# Patient Record
Sex: Female | Born: 1935 | Race: White | Hispanic: No | Smoking: Never smoker
Health system: Southern US, Community
[De-identification: ages and names within clinical notes are randomized; demographics above are authoritative.]

## PROBLEM LIST (undated history)

## (undated) DIAGNOSIS — F329 Major depressive disorder, single episode, unspecified: Secondary | ICD-10-CM

## (undated) DIAGNOSIS — F419 Anxiety disorder, unspecified: Secondary | ICD-10-CM

## (undated) DIAGNOSIS — T7840XA Allergy, unspecified, initial encounter: Secondary | ICD-10-CM

## (undated) DIAGNOSIS — I2699 Other pulmonary embolism without acute cor pulmonale: Secondary | ICD-10-CM

## (undated) DIAGNOSIS — Z862 Personal history of diseases of the blood and blood-forming organs and certain disorders involving the immune mechanism: Secondary | ICD-10-CM

## (undated) DIAGNOSIS — K760 Fatty (change of) liver, not elsewhere classified: Secondary | ICD-10-CM

## (undated) DIAGNOSIS — C801 Malignant (primary) neoplasm, unspecified: Secondary | ICD-10-CM

## (undated) DIAGNOSIS — R0789 Other chest pain: Secondary | ICD-10-CM

## (undated) DIAGNOSIS — K5732 Diverticulitis of large intestine without perforation or abscess without bleeding: Secondary | ICD-10-CM

## (undated) DIAGNOSIS — E781 Pure hyperglyceridemia: Secondary | ICD-10-CM

## (undated) DIAGNOSIS — H8110 Benign paroxysmal vertigo, unspecified ear: Secondary | ICD-10-CM

## (undated) DIAGNOSIS — D869 Sarcoidosis, unspecified: Secondary | ICD-10-CM

## (undated) DIAGNOSIS — I447 Left bundle-branch block, unspecified: Secondary | ICD-10-CM

## (undated) DIAGNOSIS — I10 Essential (primary) hypertension: Secondary | ICD-10-CM

## (undated) DIAGNOSIS — F32A Depression, unspecified: Secondary | ICD-10-CM

## (undated) DIAGNOSIS — M48061 Spinal stenosis, lumbar region without neurogenic claudication: Secondary | ICD-10-CM

## (undated) DIAGNOSIS — M25519 Pain in unspecified shoulder: Secondary | ICD-10-CM

## (undated) DIAGNOSIS — K644 Residual hemorrhoidal skin tags: Secondary | ICD-10-CM

## (undated) DIAGNOSIS — Z972 Presence of dental prosthetic device (complete) (partial): Secondary | ICD-10-CM

## (undated) DIAGNOSIS — Z973 Presence of spectacles and contact lenses: Secondary | ICD-10-CM

## (undated) HISTORY — PX: HX SMALL BOWEL RESECTION: SHX150

## (undated) HISTORY — PX: HX GALL BLADDER SURGERY/CHOLE: SHX55

## (undated) HISTORY — PX: HX APPENDECTOMY: SHX54

## (undated) HISTORY — PX: HX HYSTERECTOMY: SHX81

## (undated) HISTORY — PX: HX MASTECTOMY, SIMPLE: SHX3

## (undated) HISTORY — DX: Pain in unspecified shoulder: M25.519

## (undated) HISTORY — DX: Depression, unspecified: F32.A

## (undated) HISTORY — DX: Personal history of diseases of the blood and blood-forming organs and certain disorders involving the immune mechanism: Z86.2

## (undated) HISTORY — DX: Fatty (change of) liver, not elsewhere classified: K76.0

## (undated) HISTORY — DX: Allergy, unspecified, initial encounter: T78.40XA

## (undated) HISTORY — DX: Spinal stenosis, lumbar region without neurogenic claudication: M48.061

## (undated) HISTORY — DX: Diverticulitis of large intestine without perforation or abscess without bleeding: K57.32

## (undated) HISTORY — DX: Sarcoidosis, unspecified: D86.9

## (undated) HISTORY — DX: Other chest pain: R07.89

## (undated) HISTORY — DX: Residual hemorrhoidal skin tags: K64.4

## (undated) HISTORY — DX: Pure hyperglyceridemia: E78.1

## (undated) HISTORY — PX: OTHER SURGICAL HISTORY: SHX169

## (undated) HISTORY — DX: Benign paroxysmal vertigo, unspecified ear: H81.10

## (undated) HISTORY — DX: Anxiety disorder, unspecified: F41.9

## (undated) HISTORY — DX: Other pulmonary embolism without acute cor pulmonale: I26.99

## (undated) HISTORY — DX: Essential (primary) hypertension: I10

## (undated) HISTORY — DX: Left bundle-branch block, unspecified: I44.7

## (undated) HISTORY — DX: Major depressive disorder, single episode, unspecified: F32.9

---

## 1973-05-10 HISTORY — PX: MASTECTOMY: SHX3

## 1973-05-10 HISTORY — PX: BREAST SURGERY: SHX581

## 1978-05-10 HISTORY — PX: ABDOMINAL HYSTERECTOMY: SHX81

## 1980-05-10 HISTORY — PX: BILATERAL OOPHORECTOMY: SHX1221

## 1987-05-11 HISTORY — PX: PARTIAL COLECTOMY: SHX5273

## 1988-05-10 HISTORY — PX: OTHER SURGICAL HISTORY: SHX169

## 1989-05-10 HISTORY — PX: CHOLECYSTECTOMY: SHX55

## 2004-07-08 LAB — HM COLONOSCOPY

## 2004-12-03 ENCOUNTER — Ambulatory Visit: Payer: Self-pay | Admitting: Internal Medicine

## 2004-12-31 ENCOUNTER — Ambulatory Visit: Payer: Self-pay | Admitting: Internal Medicine

## 2005-01-21 ENCOUNTER — Ambulatory Visit: Payer: Self-pay | Admitting: Internal Medicine

## 2005-03-04 ENCOUNTER — Ambulatory Visit: Payer: Self-pay | Admitting: Internal Medicine

## 2005-03-09 ENCOUNTER — Ambulatory Visit: Payer: Self-pay | Admitting: Family Medicine

## 2005-05-07 ENCOUNTER — Ambulatory Visit: Payer: Self-pay | Admitting: Family Medicine

## 2005-06-01 ENCOUNTER — Ambulatory Visit: Payer: Self-pay | Admitting: Internal Medicine

## 2005-06-02 ENCOUNTER — Ambulatory Visit: Payer: Self-pay | Admitting: Family Medicine

## 2005-06-04 ENCOUNTER — Ambulatory Visit: Payer: Self-pay | Admitting: Internal Medicine

## 2005-06-16 ENCOUNTER — Ambulatory Visit: Payer: Self-pay | Admitting: Family Medicine

## 2005-06-18 ENCOUNTER — Ambulatory Visit: Payer: Self-pay | Admitting: Family Medicine

## 2005-06-28 ENCOUNTER — Ambulatory Visit: Payer: Self-pay | Admitting: Family Medicine

## 2005-08-04 ENCOUNTER — Ambulatory Visit: Payer: Self-pay | Admitting: Family Medicine

## 2005-11-08 ENCOUNTER — Ambulatory Visit: Payer: Self-pay | Admitting: Family Medicine

## 2005-11-19 ENCOUNTER — Ambulatory Visit: Payer: Self-pay | Admitting: Family Medicine

## 2006-02-11 ENCOUNTER — Ambulatory Visit: Payer: Self-pay | Admitting: Family Medicine

## 2006-02-15 DIAGNOSIS — K5732 Diverticulitis of large intestine without perforation or abscess without bleeding: Secondary | ICD-10-CM | POA: Insufficient documentation

## 2006-02-15 DIAGNOSIS — D869 Sarcoidosis, unspecified: Secondary | ICD-10-CM

## 2006-02-15 DIAGNOSIS — E781 Pure hyperglyceridemia: Secondary | ICD-10-CM | POA: Insufficient documentation

## 2006-04-06 ENCOUNTER — Ambulatory Visit: Payer: Self-pay | Admitting: Family Medicine

## 2006-04-06 DIAGNOSIS — F330 Major depressive disorder, recurrent, mild: Secondary | ICD-10-CM

## 2006-04-06 DIAGNOSIS — M159 Polyosteoarthritis, unspecified: Secondary | ICD-10-CM | POA: Insufficient documentation

## 2006-04-06 DIAGNOSIS — J309 Allergic rhinitis, unspecified: Secondary | ICD-10-CM | POA: Insufficient documentation

## 2006-04-12 ENCOUNTER — Telehealth: Payer: Self-pay | Admitting: Family Medicine

## 2006-04-19 ENCOUNTER — Ambulatory Visit: Payer: Self-pay | Admitting: Family Medicine

## 2006-04-22 ENCOUNTER — Telehealth: Payer: Self-pay | Admitting: Family Medicine

## 2006-07-21 ENCOUNTER — Encounter: Payer: Self-pay | Admitting: Family Medicine

## 2006-09-06 ENCOUNTER — Ambulatory Visit: Payer: Self-pay | Admitting: Family Medicine

## 2006-10-09 DIAGNOSIS — M48061 Spinal stenosis, lumbar region without neurogenic claudication: Secondary | ICD-10-CM

## 2006-10-09 HISTORY — DX: Spinal stenosis, lumbar region without neurogenic claudication: M48.061

## 2006-10-17 ENCOUNTER — Ambulatory Visit: Payer: Self-pay | Admitting: Family Medicine

## 2006-10-20 ENCOUNTER — Telehealth: Payer: Self-pay | Admitting: Family Medicine

## 2006-10-21 ENCOUNTER — Telehealth (INDEPENDENT_AMBULATORY_CARE_PROVIDER_SITE_OTHER): Payer: Self-pay | Admitting: *Deleted

## 2006-10-24 ENCOUNTER — Telehealth: Payer: Self-pay | Admitting: Family Medicine

## 2006-10-24 ENCOUNTER — Ambulatory Visit: Payer: Self-pay | Admitting: Family Medicine

## 2006-11-18 ENCOUNTER — Telehealth: Payer: Self-pay | Admitting: Family Medicine

## 2006-12-21 ENCOUNTER — Encounter: Payer: Self-pay | Admitting: Family Medicine

## 2007-02-22 ENCOUNTER — Ambulatory Visit: Payer: Self-pay | Admitting: Family Medicine

## 2007-03-24 ENCOUNTER — Ambulatory Visit: Payer: Self-pay | Admitting: Family Medicine

## 2007-03-24 DIAGNOSIS — M810 Age-related osteoporosis without current pathological fracture: Secondary | ICD-10-CM | POA: Insufficient documentation

## 2007-03-24 DIAGNOSIS — I10 Essential (primary) hypertension: Secondary | ICD-10-CM

## 2007-03-28 ENCOUNTER — Encounter: Admission: RE | Admit: 2007-03-28 | Discharge: 2007-03-28 | Payer: Self-pay | Admitting: Family Medicine

## 2007-03-30 ENCOUNTER — Ambulatory Visit: Payer: Self-pay | Admitting: Family Medicine

## 2007-04-04 ENCOUNTER — Encounter: Payer: Self-pay | Admitting: Family Medicine

## 2007-04-20 ENCOUNTER — Ambulatory Visit: Payer: Self-pay | Admitting: Family Medicine

## 2007-04-21 ENCOUNTER — Telehealth (INDEPENDENT_AMBULATORY_CARE_PROVIDER_SITE_OTHER): Payer: Self-pay | Admitting: *Deleted

## 2007-04-26 ENCOUNTER — Telehealth (INDEPENDENT_AMBULATORY_CARE_PROVIDER_SITE_OTHER): Payer: Self-pay | Admitting: *Deleted

## 2007-12-05 ENCOUNTER — Encounter: Admission: RE | Admit: 2007-12-05 | Discharge: 2007-12-05 | Payer: Self-pay | Admitting: Family Medicine

## 2007-12-05 ENCOUNTER — Ambulatory Visit: Payer: Self-pay | Admitting: Family Medicine

## 2007-12-05 DIAGNOSIS — R0602 Shortness of breath: Secondary | ICD-10-CM | POA: Insufficient documentation

## 2008-02-20 ENCOUNTER — Telehealth: Payer: Self-pay | Admitting: Family Medicine

## 2008-03-01 ENCOUNTER — Ambulatory Visit: Payer: Self-pay | Admitting: Family Medicine

## 2008-03-01 DIAGNOSIS — M549 Dorsalgia, unspecified: Secondary | ICD-10-CM | POA: Insufficient documentation

## 2008-03-04 ENCOUNTER — Telehealth: Payer: Self-pay | Admitting: Family Medicine

## 2008-08-14 ENCOUNTER — Ambulatory Visit: Payer: Self-pay | Admitting: Family Medicine

## 2008-09-23 ENCOUNTER — Ambulatory Visit: Payer: Self-pay | Admitting: Family Medicine

## 2008-09-24 ENCOUNTER — Ambulatory Visit: Payer: Self-pay | Admitting: Family Medicine

## 2008-09-24 ENCOUNTER — Encounter: Admission: RE | Admit: 2008-09-24 | Discharge: 2008-09-24 | Payer: Self-pay | Admitting: Family Medicine

## 2008-10-14 ENCOUNTER — Telehealth: Payer: Self-pay | Admitting: Family Medicine

## 2008-11-05 ENCOUNTER — Ambulatory Visit: Payer: Self-pay | Admitting: Family Medicine

## 2008-11-05 DIAGNOSIS — M48061 Spinal stenosis, lumbar region without neurogenic claudication: Secondary | ICD-10-CM | POA: Insufficient documentation

## 2008-11-07 ENCOUNTER — Telehealth (INDEPENDENT_AMBULATORY_CARE_PROVIDER_SITE_OTHER): Payer: Self-pay | Admitting: *Deleted

## 2008-11-19 ENCOUNTER — Telehealth: Payer: Self-pay | Admitting: Family Medicine

## 2009-02-08 ENCOUNTER — Encounter: Payer: Self-pay | Admitting: Family Medicine

## 2009-03-24 ENCOUNTER — Telehealth: Payer: Self-pay | Admitting: Family Medicine

## 2009-03-24 ENCOUNTER — Ambulatory Visit: Payer: Self-pay | Admitting: Family Medicine

## 2009-03-24 DIAGNOSIS — K644 Residual hemorrhoidal skin tags: Secondary | ICD-10-CM | POA: Insufficient documentation

## 2009-03-24 DIAGNOSIS — H8110 Benign paroxysmal vertigo, unspecified ear: Secondary | ICD-10-CM | POA: Insufficient documentation

## 2009-05-06 ENCOUNTER — Ambulatory Visit: Payer: Self-pay | Admitting: Family Medicine

## 2009-05-06 ENCOUNTER — Encounter: Admission: RE | Admit: 2009-05-06 | Discharge: 2009-05-06 | Payer: Self-pay | Admitting: Family Medicine

## 2009-05-06 DIAGNOSIS — M25519 Pain in unspecified shoulder: Secondary | ICD-10-CM | POA: Insufficient documentation

## 2009-06-13 ENCOUNTER — Ambulatory Visit: Payer: Self-pay | Admitting: Family Medicine

## 2009-06-27 ENCOUNTER — Telehealth: Payer: Self-pay | Admitting: Family Medicine

## 2009-07-01 ENCOUNTER — Ambulatory Visit: Payer: Self-pay | Admitting: Family Medicine

## 2009-07-01 ENCOUNTER — Telehealth: Payer: Self-pay | Admitting: Family Medicine

## 2009-07-01 ENCOUNTER — Encounter: Admission: RE | Admit: 2009-07-01 | Discharge: 2009-07-01 | Payer: Self-pay | Admitting: Family Medicine

## 2009-07-01 LAB — CONVERTED CEMR LAB
Basophils Absolute: 0 10*3/uL (ref 0.0–0.1)
Basophils Relative: 0 % (ref 0–1)
CO2: 30 meq/L (ref 19–32)
Calcium: 9.3 mg/dL (ref 8.4–10.5)
Creatinine, Ser: 1.4 mg/dL — ABNORMAL HIGH (ref 0.40–1.20)
Eosinophils Absolute: 0.1 10*3/uL (ref 0.0–0.7)
Eosinophils Relative: 3 % (ref 0–5)
Hemoglobin: 13.4 g/dL (ref 12.0–15.0)
Lymphs Abs: 1.8 10*3/uL (ref 0.7–4.0)
MCHC: 34.8 g/dL (ref 30.0–36.0)
Monocytes Relative: 2 % — ABNORMAL LOW (ref 3–12)
Potassium: 4.5 meq/L (ref 3.5–5.3)
WBC: 4.9 10*3/uL (ref 4.0–10.5)

## 2009-07-07 ENCOUNTER — Encounter: Payer: Self-pay | Admitting: Family Medicine

## 2009-07-08 ENCOUNTER — Telehealth (INDEPENDENT_AMBULATORY_CARE_PROVIDER_SITE_OTHER): Payer: Self-pay | Admitting: *Deleted

## 2009-07-18 ENCOUNTER — Telehealth: Payer: Self-pay | Admitting: Family Medicine

## 2009-07-25 ENCOUNTER — Telehealth (INDEPENDENT_AMBULATORY_CARE_PROVIDER_SITE_OTHER): Payer: Self-pay | Admitting: *Deleted

## 2009-09-08 ENCOUNTER — Ambulatory Visit: Payer: Self-pay | Admitting: Family Medicine

## 2009-09-08 DIAGNOSIS — F411 Generalized anxiety disorder: Secondary | ICD-10-CM | POA: Insufficient documentation

## 2009-09-09 ENCOUNTER — Telehealth: Payer: Self-pay | Admitting: Family Medicine

## 2009-09-09 ENCOUNTER — Encounter: Payer: Self-pay | Admitting: Family Medicine

## 2009-09-12 ENCOUNTER — Encounter: Payer: Self-pay | Admitting: Family Medicine

## 2009-09-12 LAB — CONVERTED CEMR LAB
Cholesterol: 236 mg/dL — ABNORMAL HIGH (ref 0–200)
HDL: 47 mg/dL (ref >39)
LDL Cholesterol: 154 mg/dL — ABNORMAL HIGH (ref 0–99)
LDL Goal: 130 mg/dL
Total CHOL/HDL Ratio: 5
Triglycerides: 176 mg/dL — ABNORMAL HIGH (ref <150)

## 2009-12-06 ENCOUNTER — Ambulatory Visit: Payer: Self-pay | Admitting: Emergency Medicine

## 2009-12-06 DIAGNOSIS — F329 Major depressive disorder, single episode, unspecified: Secondary | ICD-10-CM

## 2009-12-06 DIAGNOSIS — J209 Acute bronchitis, unspecified: Secondary | ICD-10-CM | POA: Insufficient documentation
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
## 2010-06-09 NOTE — Letter (Signed)
Summary: Depression & Anxiety Questionnaire/[facility]  Depression & Anxiety Questionnaire/[facility]   Imported By: Lanelle Bal 09/15/2009 08:38:39  _____________________________________________________________________  External Attachment:    Type:   Image     Comment:   External Document
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
## 2010-06-09 NOTE — Assessment & Plan Note (Signed)
Summary: Cough-clear, wheezing x today rm 3   Vital Signs:  Patient Profile:   75 Years Old Female CC:      Cold & URI symptoms Height:     61.5 inches Weight:      136 pounds O2 Sat:      98 % O2 treatment:    Room Air Temp:     97.3 degrees F oral Pulse rate:   74 / minute Pulse rhythm:   regular Resp:     16 per minute BP sitting:   139 / 69  (right arm) Cuff size:   regular  Vitals Entered By: Areta Haber CMA (December 06, 2009 9:20 AM)                  Current Allergies: No known allergies History of Present Illness Chief Complaint: Cold & URI symptoms History of Present Illness: URI symptoms x2 weeks.  Went to ER 2 weeks ago, given Zpak.  Felt much better than 2 days later felt worse again.  Coughing, green sputum, fatigue.  She has a history of pulmonary sarcoidosis and has multiple CXR lately and states that she had a CT last week due to a upper lobe nodule that they are following.  No CP, SOB, fever, chills, N/V.  Current Problems: ACUTE BRONCHITIS (ICD-466.0) DEPRESSION (ICD-311) ANXIETY (ICD-300.00) ANXIETY STATE, UNSPECIFIED (ICD-300.00) COUGH (ICD-786.2) SHOULDER PAIN (ICD-719.41) BENIGN POSITIONAL VERTIGO (ICD-386.11) EXTERNAL HEMORRHOIDS (ICD-455.3) SPINAL STENOSIS, LUMBAR (ICD-724.02) BACK PAIN, UPPER (ICD-724.5) SOB (ICD-786.05) OSTEOPOROSIS (ICD-733.00) ESSENTIAL HYPERTENSION, BENIGN (ICD-401.1) SPRAIN/STRAIN, LUMBAR REGION (ICD-847.2) DEPRESSIVE DISORDER, MAJOR, RCR, MILD (ICD-296.31) ALLERGIC RHINITIS (ICD-477.9) SARCOIDOSIS (ICD-135) OSTEOARTHROSIS, GENERALIZED, MULTIPLE SITES (ICD-715.09) HYPERTRIGLYCERIDEMIA (ICD-272.1) DIVERTICULITIS OF COLON, NOS (ICD-562.11)   Current Meds BONIVA 150 MG TABS (IBANDRONATE SODIUM) once by mouth monthly MULTIVITAMINS TABS (MULTIPLE VITAMIN) once by mouth daily FELDENE 10 MG CAPS (PIROXICAM) Take 1 tablet by mouth two times a day LISINOPRIL-HYDROCHLOROTHIAZIDE 20-12.5 MG TABS  (LISINOPRIL-HYDROCHLOROTHIAZIDE) Take 1 tablet by mouth once a day PREVACID 30 MG CPDR (LANSOPRAZOLE) Take 1 tablet by mouth once a day * CALCIUM WITH VITAMIN D Take 1 tablet by mouth two times a day * SUSTENEX Take 1 tablet by mouth once a day LEXAPRO 10 MG TABS (ESCITALOPRAM OXALATE) Take 1 tablet by mouth once a day ALPRAZOLAM 0.5 MG TABS (ALPRAZOLAM) 1/2 to 1 tab by mouth ddaily as needed for anxiety. SIMVASTATIN 20 MG TABS (SIMVASTATIN) Take 1 tablet by mouth once a day at bedtime MUCINEX 600 MG XR12H-TAB (GUAIFENESIN) as directed ALIGN  CAPS (PROBIOTIC PRODUCT) 1 tab by mouth once daily HYDROMET 5-1.5 MG/5ML SYRP (HYDROCODONE-HOMATROPINE) 1 tsp by mouth at bedtime as needed for cough ZITHROMAX 250 MG TABS (AZITHROMYCIN) 2 tabs by mouth on day #1, then 1 tab by mouth on days 2-10  REVIEW OF SYSTEMS Constitutional Symptoms      Denies fever, chills, night sweats, weight loss, weight gain, and fatigue.  Eyes       Denies change in vision, eye pain, eye discharge, glasses, contact lenses, and eye surgery. Ear/Nose/Throat/Mouth       Denies hearing loss/aids, change in hearing, ear pain, ear discharge, dizziness, frequent runny nose, frequent nose bleeds, sinus problems, sore throat, hoarseness, and tooth pain or bleeding.  Respiratory       Complains of productive cough and wheezing.      Denies dry cough, shortness of breath, asthma, bronchitis, and emphysema/COPD.  Cardiovascular       Denies murmurs, chest pain, and tires easily with exhertion.  Gastrointestinal       Denies stomach pain, nausea/vomiting, diarrhea, constipation, blood in bowel movements, and indigestion. Genitourniary       Denies painful urination, kidney stones, and loss of urinary control. Neurological       Denies paralysis, seizures, and fainting/blackouts. Musculoskeletal       Denies muscle pain, joint pain, joint stiffness, decreased range of motion, redness, swelling, muscle weakness, and gout.  Skin        Denies bruising, unusual mles/lumps or sores, and hair/skin or nail changes.  Psych       Denies mood changes, temper/anger issues, anxiety/stress, speech problems, depression, and sleep problems. Other Comments: Pt states she was at [facility] ER on 11/17/09 Dx Bronchitis prescribed  Z-Pak. Pt states she had Hydromet at home. Pt has not followed up with her PCP for this.   Past History:  Past Surgical History: Last updated: 03/01/2008 Bilat mastectomy-prophylactic 1975, Bilat oophorectomy  1982, carpel tunnel  1990, Cholecystectomy  1991, Hysterectomy , fibroids  1980, PFTs-no obstruction, Saline Breast Implants currently Partial colon resectin for diverticulitis  Family History: Last updated: 04/06/2006 BrCa_mother, 4 sisters, 3 neices  Social History: Last updated: 03/01/2008 Retired.  Associates degree.  Widowed.  Never smoked.  No EtOH, no drugs, 2 caffeinated drinks pr day.  No regular exercise. Grandaughter is Research scientist (physical sciences).   Risk Factors: Smoking Status: never (02/21/2006)  Past Medical History: Fatty liver on CT, G6 P3 A3(mis) Hx of sarcoidosis Lumbar stenosis at L4, L MRI 6-08; saw Dr Blanche East Anxiety Depression Physical Exam General appearance: well developed, well nourished, no acute distress Ears: normal, no lesions or deformities Nasal: mucosa pink, nonedematous, no septal deviation, turbinates normal Oral/Pharynx: tongue normal, posterior pharynx without erythema or exudate Chest/Lungs: no rales, wheezes, or rhonchi bilateral, breath sounds equal without effort Heart: regular rate and  rhythm, no murmur Skin: no obvious rashes or lesions Assessment New Problems: ACUTE BRONCHITIS (ICD-466.0) DEPRESSION (ICD-311) ANXIETY (ICD-300.00)   Patient Education: Patient and/or caregiver instructed in the following: rest, fluids.  Plan New Medications/Changes: ZITHROMAX 250 MG TABS (AZITHROMYCIN) 2 tabs by mouth on day #1, then 1 tab by mouth on days 2-10  #11 x 0,  12/06/2009, Hoyt Koch MD HYDROMET 5-1.5 MG/5ML SYRP (HYDROCODONE-HOMATROPINE) 1 tsp by mouth at bedtime as needed for cough  #90 x 0, 12/06/2009, Hoyt Koch MD  New Orders: Est. Patient Level II [phone]  The patient and/or caregiver has been counseled thoroughly with regard to medications prescribed including dosage, schedule, interactions, rationale for use, and possible side effects and they verbalize understanding.  Diagnoses and expected course of recovery discussed and will return if not improved as expected or if the condition worsens. Patient and/or caregiver verbalized understanding.  Prescriptions: ZITHROMAX 250 MG TABS (AZITHROMYCIN) 2 tabs by mouth on day #1, then 1 tab by mouth on days 2-10  #11 x 0   Entered and Authorized by:   Hoyt Koch MD   Signed by:   Hoyt Koch MD on 12/06/2009   Method used:   Printed then faxed to ...       [facility] (retail)       [address]       [place], [place]  [address]       Ph: [phone]       Fax: [phone]   RxID:   [id number] HYDROMET 5-1.5 MG/5ML SYRP (HYDROCODONE-HOMATROPINE) 1 tsp by mouth at bedtime as needed for cough  #90 x 0   Entered and Authorized by:  Hoyt Koch MD   Signed by:   Hoyt Koch MD on 12/06/2009   Method used:   Printed then faxed to ...       [facility] (retail)       [address]       [place], [place]  [address]       Ph: [phone]       Fax: [phone]   RxID:   [id number]   Patient Instructions: 1)  Rest, Hydration 2)  Follow-up with your primary care physician in 1-2 weeks to check on resolution of your bronchitis. 3)  Follow-up with your primary care physician sooner (or go to the ER) if any worsening symptoms (fever, chills, vomiting, etc)  Orders Added: 1)  Est. Patient Level II [[address]]

## 2010-06-09 NOTE — Progress Notes (Signed)
----   Converted from flag ---- ---- 07/25/2009 10:32 AM, Fabienne Bruns wrote: Contacted patient and advised that I will call [employer], however, the issues she is describing is [employer] issue and we may not be able to assist.    ---- 07/24/2009 2:15 PM, Kathlene November wrote: Caryl Asp- this patient calls and states keeps getting a bill from [place] for 29.00 for her OV on top of the co pay she pays at time of service. Has called [employer] and they say they can't help her. She called [employer] and was told we are submitting the wrong tax number. Not sure of what she is talking about so I am turning this over to you.  Thanks Selena Batten ------------------------------

## 2010-06-09 NOTE — Assessment & Plan Note (Signed)
Summary: Cough x 6 days    Vital Signs:  Patient profile:   75 year old female Height:      61.5 inches Weight:      137 pounds Temp:     97.5 degrees F oral Pulse rate:   93 / minute BP sitting:   116 / 63  (left arm) Cuff size:   regular  Vitals Entered By: Kathlene November (July 01, 2009 10:46 AM) CC: fatigue, cough, SOB, no fever, bodyaches or H/A   Primary Care Provider:  Nani Gasser MD  CC:  fatigue, cough, SOB, no fever, and bodyaches or H/A.  History of Present Illness: fatigue, cough, SOB, no fever, bodyaches or H/A for about 6 days.  Feels weak all over. Last time flet this way had a PE. No runny nose. Hearing some crackling in her chest.  Using some Rx  cough syrup and it works.  Couldn't get in with her pulm until March. Dry cough. Day 6 of sxs. Also has hx of scarcoidosis.  No exacerbatint or alleviatig sxs.  Does feel SOB at rest.   Current Medications (verified): 1)  Boniva 150 Mg Tabs (Ibandronate Sodium) .... Once By Mouth Monthly 2)  Multivitamins Tabs (Multiple Vitamin) .... Once By Mouth Daily 3)  Feldene 10 Mg Caps (Piroxicam) .... Take 1 Tablet By Mouth Two Times A Day 4)  Lisinopril-Hydrochlorothiazide 20-12.5 Mg Tabs (Lisinopril-Hydrochlorothiazide) .... Take 1 Tablet By Mouth Once A Day 5)  Fexofenadine Hcl 180 Mg Tabs (Fexofenadine Hcl) .... Take 1 Tablet By Mouth Once A Day 6)  Prevacid 30 Mg Cpdr (Lansoprazole) .... Take 1 Tablet By Mouth Once A Day 7)  Tramadol Hcl 50 Mg Tabs (Tramadol Hcl) .... Take 1 Tablet By Mouth Three Times A Day As Needed Severe Pain or At Bedtime. 8)  Cyclobenzaprine Hcl 10 Mg Tabs (Cyclobenzaprine Hcl) .... Take 1 Tablet By Mouth Three Times A Day As Needed For Muscles Spasm. 9)  Famciclovir 500 Mg Tabs (Famciclovir) .... One By Mouth Eveyr 8 Hours For 7 Days 10)  Lidocaine Hcl 2 % Gel (Lidocaine Hcl) .... Apply  Up To 3 X A Day To Affected Area. 11)  Calcium With Vitamin D .... Take 1 Tablet By Mouth Two Times A Day 12)   Sustenex .... Take 1 Tablet By Mouth Once A Day 13)  Hydrocodone-Homatropine 5-1.5 Mg/45ml Syrp (Hydrocodone-Homatropine) .... 5ml By Mouth At Bedtime As Needed For Cough  Allergies (verified): No Known Drug Allergies  Comments:  Nurse/Medical Assistant: The patient's medications and allergies were reviewed with the patient and were updated in the Medication and Allergy Lists. Kathlene November (July 01, 2009 10:47 AM)  Past History:  Past Surgical History: Last updated: 03/01/2008 Bilat mastectomy-prophylactic 1975, Bilat oophorectomy  1982, carpel tunnel  1990, Cholecystectomy  1991, Hysterectomy , fibroids  1980, PFTs-no obstruction, Saline Breast Implants currently Partial colon resectin for diverticulitis  Family History: Reviewed history from 04/06/2006 and no changes required. BrCa_mother, 4 sisters, 3 neices  Physical Exam  General:  Well-developed,well-nourished,in no acute distress; alert,appropriate and cooperative throughout examination Head:  Normocephalic and atraumatic without obvious abnormalities. No apparent alopecia or balding. Eyes:  No corneal or conjunctival inflammation noted. EOMI. Perrla.  Ears:  External ear exam shows no significant lesions or deformities.  Otoscopic examination reveals clear canals, tympanic membranes are intact bilaterally without bulging, retraction, inflammation or discharge. Hearing is grossly normal bilaterally. Nose:  External nasal examination shows no deformity or inflammation. Nasal mucosa are pink and moist  without lesions or exudates. Mouth:  Oral mucosa and oropharynx without lesions or exudates.  Teeth in good repair. Neck:  No deformities, masses, or tenderness noted. Lungs:  Normal respiratory effort, chest expands symmetrically. Lungs are clear to auscultation, no crackles or wheezes. Heart:  Normal rate and regular rhythm. S1 and S2 normal without gallop, murmur, click, rub or other extra sounds. Skin:  no rashes.     Cervical Nodes:  No lymphadenopathy noted Psych:  Cognition and judgment appear intact. Alert and cooperative with normal attention span and concentration. No apparent delusions, illusions, hallucinations   Impression & Recommendations:  Problem # 1:  COUGH (ICD-786.2) Assessment New Unclear etiology. May be viral but really doesn't have any other URI sxs.  Also will get CXR to rule ou tinfection. D-dimer to rule out PE since has had a hx of PE. Says that was years ago and off of coumadin. Will get stat labs and CXR and will call her back later today.  If SOB gets worse call 911 but right now her VS are stable. Her pulse ox is down just a little from baseline but not much.  Orders: T-Chest x-ray, 2 views ([id number]) T-CBC w/Diff ([id number]) T-D-Dimer Fibrin Derivatives Quantitive [id number]) T-Basic Metabolic Panel [id number]) T-Chest x-ray, 1 view ([id number])  Complete Medication List: 1)  Boniva 150 Mg Tabs (Ibandronate sodium) .... Once by mouth monthly 2)  Multivitamins Tabs (Multiple vitamin) .... Once by mouth daily 3)  Feldene 10 Mg Caps (Piroxicam) .... Take 1 tablet by mouth two times a day 4)  Lisinopril-hydrochlorothiazide 20-12.5 Mg Tabs (Lisinopril-hydrochlorothiazide) .... Take 1 tablet by mouth once a day 5)  Fexofenadine Hcl 180 Mg Tabs (Fexofenadine hcl) .... Take 1 tablet by mouth once a day 6)  Prevacid 30 Mg Cpdr (Lansoprazole) .... Take 1 tablet by mouth once a day 7)  Tramadol Hcl 50 Mg Tabs (Tramadol hcl) .... Take 1 tablet by mouth three times a day as needed severe pain or at bedtime. 8)  Cyclobenzaprine Hcl 10 Mg Tabs (Cyclobenzaprine hcl) .... Take 1 tablet by mouth three times a day as needed for muscles spasm. 9)  Famciclovir 500 Mg Tabs (Famciclovir) .... One by mouth eveyr 8 hours for 7 days 10)  Lidocaine Hcl 2 % Gel (Lidocaine hcl) .... Apply  up to 3 x a day to affected area. 11)  Calcium With Vitamin D  .... Take 1 tablet by mouth two times a day 12)   Sustenex  .... Take 1 tablet by mouth once a day 13)  Hydrocodone-homatropine 5-1.5 Mg/88ml Syrp (Hydrocodone-homatropine) .... 5ml by mouth at bedtime as needed for cough

## 2010-06-09 NOTE — Progress Notes (Signed)
Summary: CT results  Phone Note Outgoing Call   Summary of Call: Call pt: Chest CT showed no significant pulmonary lesions. Does have some hardening of the arteries.  REc we test cholesterol since because of this.  Initial call taken by: Nani Gasser MD,  July 08, 2009 8:11 AM  Follow-up for Phone Call        Pt aware Follow-up by: Payton Spark CMA,  July 08, 2009 12:40 PM

## 2010-06-09 NOTE — Progress Notes (Signed)
Summary: N/V  Phone Note Call from Patient Call back at Home Phone [phone]   Caller: Daughter Call For: Nani Gasser MD Summary of Call: Stomach virus - nausea and vomiting since yesterday and diarrhea. Can you call in phenergan for hjer to [facility]. Initial call taken by: Kathlene November,  July 18, 2009 8:11 AM  Follow-up for Phone Call        Rx sent.  Follow-up by: Nani Gasser MD,  July 18, 2009 8:13 AM    New/Updated Medications: PROMETHAZINE HCL 25 MG TABS (PROMETHAZINE HCL) one by mouth every 4-6 hours as needed nausea Prescriptions: PROMETHAZINE HCL 25 MG TABS (PROMETHAZINE HCL) one by mouth every 4-6 hours as needed nausea  #8 x 0   Entered and Authorized by:   Nani Gasser MD   Signed by:   Nani Gasser MD on 07/18/2009   Method used:   Electronically to        [facility] (retail)       [address]       [place], [place]  [address]       Ph: [phone]       Fax: [phone]   RxID:   [id number]

## 2010-06-09 NOTE — Progress Notes (Signed)
Summary: letter  Phone Note Call from Patient Call back at Home Phone [phone]   Caller: Patient Call For: Nani Gasser MD Summary of Call: Pt needs something faxed to the [facility] with her diagnosis and med prescribed for the anxiety because they are trying to solve the problem and needs document stating hshe came to doc and treatment. Fax to [phone] Attn: Rosey Bath Initial call taken by: Kathlene November,  Sep 09, 2009 3:47 PM  Follow-up for Phone Call        [facility] to fax letter.  Follow-up by: Nani Gasser MD,  Sep 09, 2009 5:03 PM  Additional Follow-up for Phone Call Additional follow up Details #1::        faxed letter Additional Follow-up by: Kathlene November,  Sep 10, 2009 7:49 AM

## 2010-06-09 NOTE — Assessment & Plan Note (Signed)
Summary: BACK PAIN   Vital Signs:  Patient profile:   75 year old female Height:      61.5 inches Weight:      138 pounds O2 Sat:      98 % Temp:     98.7 degrees F oral Pulse rate:   94 / minute BP sitting:   125 / 79  (right arm)  Vitals Entered By: Charolett Bumpers (June 13, 2009 11:02 AM) CC: DISCUSS CONOSCOPY AND ZOSTAVAX / HERE FOR BACK PAIN Pain Assessment Patient in pain? yes     Location: back Intensity: 2 Type: aching   Primary Care Provider:  Nani Gasser MD  CC:  DISCUSS CONOSCOPY AND ZOSTAVAX / HERE FOR BACK PAIN.  History of Present Illness: DISCUSS CONOSCOPY AND ZOSTAVAX / HERE FOR BACK PAIN.  Waking up with a Catch in her left flank.  Painful to lay on that side.  Now radiating to the side. Started about 3 day.  No trauma.  No old injuries.Still painful during the day. Taking pirixicama nd Tyelnol - helps some.  Last night pain was 6-7/10.  No radiation.  Worse if twists to the right. Not bothered by walking. Worse when sits. Wore a garter and that seemed to help.  Used heating pad last night wiht some relief.   Allergies: No Known Drug Allergies  Past History:  Past Surgical History: Last updated: 03/01/2008 Bilat mastectomy-prophylactic 1975, Bilat oophorectomy  1982, carpel tunnel  1990, Cholecystectomy  1991, Hysterectomy , fibroids  1980, PFTs-no obstruction, Saline Breast Implants currently Partial colon resectin for diverticulitis  Physical Exam  General:  Well-developed,well-nourished,in no acute distress; alert,appropriate and cooperative throughout examination Head:  Normocephalic and atraumatic without obvious abnormalities. No apparent alopecia or balding. Msk:  Normal flexion, extension, rotation irght and left, side bending. Nontender over her spine.  TEnder over the left mid thoracid paraspinous muscles and tender right below her left breast. ON her left mid back has a 1 x 2 cm erythematous patch ths it flat and appears dry.  Neg  straight leg raise.  Hip knee and ankle strength 5/5  Neurologic:  Patellar 2+  bilat  Skin:  no rashes.   Psych:  Cognition and judgment appear intact. Alert and cooperative with normal attention span and concentration. No apparent delusions, illusions, hallucinations   Impression & Recommendations:  Problem # 1:  BACK PAIN, THORACIC REGION, LEFT (ICD-724.1) Because of the distribution of her pain and the small red area on her back I am worried about possible shingles.  Pt felt the red area may have been irritation from the heating pad. Asked her to monitor for rash and if starts then please fill rx for the famciclovir and teh topical lidocaine gel. In teh meantime will treat as musculoskeletal with NSAID, Muscle relaxer, adn tramadol at bedtime as needed. Call if sxs worsen or no imrovement in one week adn consider getting xray at that time.  Her updated medication list for this problem includes:    Feldene 10 Mg Caps (Piroxicam) .Marland Kitchen... Take 1 tablet by mouth two times a day    Tramadol Hcl 50 Mg Tabs (Tramadol hcl) .Marland Kitchen... Take 1 tablet by mouth three times a day as needed severe pain or at bedtime.    Cyclobenzaprine Hcl 10 Mg Tabs (Cyclobenzaprine hcl) .Marland Kitchen... Take 1 tablet by mouth three times a day as needed for muscles spasm.  Complete Medication List: 1)  Boniva 150 Mg Tabs (Ibandronate sodium) .... Once by mouth monthly  2)  Multivitamins Tabs (Multiple vitamin) .... Once by mouth daily 3)  Feldene 10 Mg Caps (Piroxicam) .... Take 1 tablet by mouth two times a day 4)  Lisinopril-hydrochlorothiazide 20-12.5 Mg Tabs (Lisinopril-hydrochlorothiazide) .... Take 1 tablet by mouth once a day 5)  Fexofenadine Hcl 180 Mg Tabs (Fexofenadine hcl) .... Take 1 tablet by mouth once a day 6)  Prevacid 30 Mg Cpdr (Lansoprazole) .... Take 1 tablet by mouth once a day 7)  Tramadol Hcl 50 Mg Tabs (Tramadol hcl) .... Take 1 tablet by mouth three times a day as needed severe pain or at bedtime. 8)   Cyclobenzaprine Hcl 10 Mg Tabs (Cyclobenzaprine hcl) .... Take 1 tablet by mouth three times a day as needed for muscles spasm. 9)  Famciclovir 500 Mg Tabs (Famciclovir) .... One by mouth eveyr 8 hours for 7 days 10)  Lidocaine Hcl 2 % Gel (Lidocaine hcl) .... Apply  up to 3 x a day to affected area. 11)  Calcium With Vitamin D  .... Take 1 tablet by mouth two times a day 12)  Sustenex  .... Take 1 tablet by mouth once a day Prescriptions: SUSTENEX Take 1 tablet by mouth once a day  #90 x PRN   Entered and Authorized by:   Nani Gasser MD   Signed by:   Nani Gasser MD on 06/13/2009   Method used:   Print then Give to Patient   RxID:   [id number] MULTIVITAMINS TABS (MULTIPLE VITAMIN) once by mouth daily  #90 x PRN   Entered and Authorized by:   Nani Gasser MD   Signed by:   Nani Gasser MD on 06/13/2009   Method used:   Print then Give to Patient   RxID:   [id number] CALCIUM WITH VITAMIN D Take 1 tablet by mouth two times a day  #180 x PRN   Entered and Authorized by:   Nani Gasser MD   Signed by:   Nani Gasser MD on 06/13/2009   Method used:   Print then Give to Patient   RxID:   [id number] LIDOCAINE HCL 2 % GEL (LIDOCAINE HCL) Apply  up to 3 x a day to affected area.  #1 tube x 1   Entered and Authorized by:   Nani Gasser MD   Signed by:   Nani Gasser MD on 06/13/2009   Method used:   Print then Give to Patient   RxID:   [id number] FAMCICLOVIR 500 MG TABS (FAMCICLOVIR) one by mouth eveyr 8 hours for 7 days  #21 x 0   Entered and Authorized by:   Nani Gasser MD   Signed by:   Nani Gasser MD on 06/13/2009   Method used:   Print then Give to Patient   RxID:   [id number] CYCLOBENZAPRINE HCL 10 MG TABS (CYCLOBENZAPRINE HCL) Take 1 tablet by mouth three times a day as needed for muscles spasm.  #30 x 0   Entered and Authorized by:   Nani Gasser MD   Signed by:   Nani Gasser MD  on 06/13/2009   Method used:   Electronically to        [facility] (retail)       [address]       [place], [place]  [address]       Ph: [phone]       Fax: [phone]   RxID:   [id number] TRAMADOL HCL 50 MG TABS (TRAMADOL HCL) Take 1 tablet by mouth three times a day as needed  severe pain or at bedtime.  #20 x 0   Entered and Authorized by:   Nani Gasser MD   Signed by:   Nani Gasser MD on 06/13/2009   Method used:   Electronically to        [facility] (retail)       [address]       [place], [place]  [address]       Ph: [phone]       Fax: [phone]   RxID:   [id number]   PAP Next Due:  Not Indicated Mammogram Next Due:  Not Indicated

## 2010-06-09 NOTE — Progress Notes (Signed)
Summary: Cough  Phone Note Call from Patient   Summary of Call: Good morning Kim, pt called coughing all night no fever, pls advise pt [phone]. Thanks, Diane  Follow-up for Phone Call        nasal drainage, chest and head congestion. No fever, H/A or bodyaches. Could she have something to help with these symtoms and for the cough. Uses [facility] Follow-up by: Kathlene November,  June 27, 2009 8:43 AM  Additional Follow-up for Phone Call Additional follow up Details #1::        If not better next week needs OV.   Additional Follow-up by: Nani Gasser MD,  June 27, 2009 9:07 AM    Additional Follow-up for Phone Call Additional follow up Details #2::    Pt notified of MD instructions and med [facility] to [facility] Follow-up by: Kathlene November,  June 27, 2009 9:24 AM  New/Updated Medications: HYDROCODONE-HOMATROPINE 5-1.5 MG/5ML SYRP (HYDROCODONE-HOMATROPINE) 5ml by mouth at bedtime as needed for cough Prescriptions: HYDROCODONE-HOMATROPINE 5-1.5 MG/5ML SYRP (HYDROCODONE-HOMATROPINE) 5ml by mouth at bedtime as needed for cough  #122ml x 0   Entered and Authorized by:   Nani Gasser MD   Signed by:   Nani Gasser MD on 06/27/2009   Method used:   Printed then faxed to ...       [facility] (retail)       [address]       [place], [place]  [address]       Ph: [phone]       Fax: [phone]   RxID:   [id number]

## 2010-06-09 NOTE — Progress Notes (Signed)
Summary: CT  Phone Note From Other Clinic   Caller: GIK Call For: [place] Summary of Call: GIK called and said radiologists recommended she have a CT of chest without contrast- need order and dx i think its gonna need precert Initial call taken by: Kathlene November,  July 01, 2009 1:12 PM  Follow-up for Phone Call        Will await labs to help r/o DVT. If d dimer elevated will need acute CT with contrast.  Follow-up by: Nani Gasser MD,  July 01, 2009 3:48 PM

## 2010-07-07 ENCOUNTER — Ambulatory Visit
Admission: RE | Admit: 2010-07-07 | Discharge: 2010-07-07 | Disposition: A | Discharge status: Home / Self Care | Payer: BC Managed Care – PPO | Source: Ambulatory Visit | Attending: Family Medicine | Admitting: Family Medicine

## 2010-07-07 ENCOUNTER — Encounter: Payer: Self-pay | Admitting: Family Medicine

## 2010-07-07 ENCOUNTER — Ambulatory Visit (INDEPENDENT_AMBULATORY_CARE_PROVIDER_SITE_OTHER): Payer: BC Managed Care – PPO | Admitting: Family Medicine

## 2010-07-07 ENCOUNTER — Other Ambulatory Visit: Payer: Self-pay | Admitting: Family Medicine

## 2010-07-07 DIAGNOSIS — D869 Sarcoidosis, unspecified: Secondary | ICD-10-CM

## 2010-07-07 DIAGNOSIS — E781 Pure hyperglyceridemia: Secondary | ICD-10-CM

## 2010-07-08 ENCOUNTER — Encounter: Payer: Self-pay | Admitting: Family Medicine

## 2010-07-08 LAB — CONVERTED CEMR LAB
ALT: 19 units/L (ref 0–35)
Albumin: 4.7 g/dL (ref 3.5–5.2)
Alkaline Phosphatase: 62 units/L (ref 39–117)
Basophils Absolute: 0 10*3/uL (ref 0.0–0.1)
Calcium: 10 mg/dL (ref 8.4–10.5)
Chloride: 102 meq/L (ref 96–112)
Cholesterol: 216 mg/dL — ABNORMAL HIGH (ref 0–200)
Eosinophils Absolute: 0.1 10*3/uL (ref 0.0–0.7)
Eosinophils Relative: 2 % (ref 0–5)
Glucose, Bld: 113 mg/dL — ABNORMAL HIGH (ref 70–99)
HCT: 41.3 % (ref 36.0–46.0)
Lymphocytes Relative: 22 % (ref 12–46)
Lymphs Abs: 1.6 10*3/uL (ref 0.7–4.0)
Neutrophils Relative %: 70 % (ref 43–77)
Platelets: 201 10*3/uL (ref 150–400)
RDW: 14.5 % (ref 11.5–15.5)
Sodium: 139 meq/L (ref 135–145)

## 2010-07-15 ENCOUNTER — Ambulatory Visit (INDEPENDENT_AMBULATORY_CARE_PROVIDER_SITE_OTHER): Payer: BC Managed Care – PPO | Admitting: Family Medicine

## 2010-07-15 ENCOUNTER — Encounter: Payer: Self-pay | Admitting: Family Medicine

## 2010-07-15 DIAGNOSIS — R0789 Other chest pain: Secondary | ICD-10-CM

## 2010-07-15 DIAGNOSIS — I447 Left bundle-branch block, unspecified: Secondary | ICD-10-CM | POA: Insufficient documentation

## 2010-07-15 DIAGNOSIS — J209 Acute bronchitis, unspecified: Secondary | ICD-10-CM

## 2010-07-15 DIAGNOSIS — D869 Sarcoidosis, unspecified: Secondary | ICD-10-CM

## 2010-07-16 NOTE — Assessment & Plan Note (Signed)
Summary: Sarcoid, lipids   Vital Signs:  Patient profile:   75 year old female Height:      61.5 inches Weight:      139 pounds O2 Sat:      97 % Pulse rate:   79 / minute BP sitting:   120 / 78  (right arm) Cuff size:   regular  Vitals Entered By: Avon Gully CMA, Duncan Dull) (July 07, 2010 9:26 AM) CC: f/u meds   Primary Care Provider:  Nani Gasser MD  CC:  f/u meds.  History of Present Illness: Says back int he spring man was harrassing her and they moved him so only used 2 of the xanax. Still on the lexapro and working well. She would like to stay on it.    2 months has been SOB, extreme fatigue, dry cough.  Has had to cancel acitivities.  Inc SOB with acitivity.  Stopped doing her water aerobics seconday to fatigued. No CP, no syncope, no dyphagia, no sputum, no nausea or vomiting.  No parethesias.  No orthopnea. Having a fluttering feeling in her chest and feels happens with fast past.  Happens about once a week.  Has seen cards and worn an Holter monitor for this in the past.  Also some pain in her midback under her bra line and this usually bothers here when sarcoid flares. Last flare was a couple of years.   Does like steroid becuase interferes with her sleep and causes inc in appetite and irritability.      Current Medications (verified): 1)  Boniva 150 Mg Tabs (Ibandronate Sodium) .... Once By Mouth Monthly 2)  Multivitamins Tabs (Multiple Vitamin) .... Once By Mouth Daily 3)  Feldene 10 Mg Caps (Piroxicam) .... Take 1 Tablet By Mouth Two Times A Day 4)  Lisinopril-Hydrochlorothiazide 20-12.5 Mg Tabs (Lisinopril-Hydrochlorothiazide) .... Take 1 Tablet By Mouth Once A Day 5)  Prevacid 30 Mg Cpdr (Lansoprazole) .... Take 1 Tablet By Mouth Once A Day 6)  Calcium With Vitamin D .... Take 1 Tablet By Mouth Two Times A Day 7)  Sustenex .... Take 1 Tablet By Mouth Once A Day 8)  Lexapro 10 Mg Tabs (Escitalopram Oxalate) .... Take 1 Tablet By Mouth Once A Day 9)   Simvastatin 20 Mg Tabs (Simvastatin) .... Take 1 Tablet By Mouth Once A Day At Bedtime 10)  Mucinex 600 Mg Xr12h-Tab (Guaifenesin) .... As Directed 11)  Align  Caps (Probiotic Product) .Marland Kitchen.. 1 Tab By Mouth Once Daily  Allergies (verified): No Known Drug Allergies  Comments:  Nurse/Medical Assistant: The patient's medications and allergies were reviewed with the patient and were updated in the Medication and Allergy Lists. Avon Gully CMA, Duncan Dull) (July 07, 2010 9:27 AM)  Physical Exam  General:  Well-developed,well-nourished,in no acute distress; alert,appropriate and cooperative throughout examination Head:  Normocephalic and atraumatic without obvious abnormalities. No apparent alopecia or balding. Eyes:  No corneal or conjunctival inflammation noted. EOMI. Perrla.  Ears:  External ear exam shows no significant lesions or deformities.  Otoscopic examination reveals clear canals, tympanic membranes are intact bilaterally without bulging, retraction, inflammation or discharge. Hearing is grossly normal bilaterally. Nose:  External nasal examination shows no deformity or inflammation. Nasal mucosa are pink and moist without lesions or exudates. Mouth:  Oral mucosa and oropharynx without lesions or exudates.  Teeth in good repair. Neck:  No deformities, masses, or tenderness noted. Lungs:  Normal respiratory effort, chest expands symmetrically. Lungs are clear to auscultation, no crackles or wheezes. Heart:  Normal rate and regular rhythm. S1 and S2 normal without gallop, murmur, click, rub or other extra sounds. Abdomen:  Bowel sounds positive,abdomen soft and non-tender without masses, organomegaly or hernias noted. Skin:  no rashes.   Cervical Nodes:  No lymphadenopathy noted Psych:  Cognition and judgment appear intact. Alert and cooperative with normal attention span and concentration. No apparent delusions, illusions, hallucinations   Impression & Recommendations:  Problem  # 1:  SARCOIDOSIS (ICD-135) Assessment Deteriorated Likely flare of Sarcoidosis.  Will get CXR and CBC to ealuate for any active infiltrate.  Will start Steroids. Pulse ox is normal no signs of deocompensation on exam.  F/U in 3 weeks .   Orders: T-CBC w/Diff ([id number]) T-Chest x-ray, 2 views (71020)  Problem # 2:  HYPERTRIGLYCERIDEMIA (ICD-272.1) She has taken her statin consistantly over the last 2 week sbut really wants to dicontinue it. She says she has changed her diet so hopes this helps.  Her updated medication list for this problem includes:    Simvastatin 20 Mg Tabs (Simvastatin) .Marland Kitchen... Take 1 tablet by mouth once a day at bedtime  Orders: T-Comprehensive Metabolic Panel [id number]) T-Lipid Profile ([id number])  Complete Medication List: 1)  Boniva 150 Mg Tabs (Ibandronate sodium) .... Once by mouth monthly 2)  Multivitamins Tabs (Multiple vitamin) .... Once by mouth daily 3)  Feldene 10 Mg Caps (Piroxicam) .... Take 1 tablet by mouth two times a day 4)  Lisinopril-hydrochlorothiazide 20-12.5 Mg Tabs (Lisinopril-hydrochlorothiazide) .... Take 1 tablet by mouth once a day 5)  Prevacid 30 Mg Cpdr (Lansoprazole) .... Take 1 tablet by mouth once a day 6)  Calcium With Vitamin D  .... Take 1 tablet by mouth two times a day 7)  Sustenex  .... Take 1 tablet by mouth once a day 8)  Lexapro 10 Mg Tabs (Escitalopram oxalate) .... Take 1 tablet by mouth once a day 9)  Simvastatin 20 Mg Tabs (Simvastatin) .... Take 1 tablet by mouth once a day at bedtime 10)  Align Caps (Probiotic product) .Marland Kitchen.. 1 tab by mouth once daily 11)  Prednisone 20 Mg Tabs (Prednisone) .... 2 tabs daily for one week, then 1 tab daily for one week, 1/2 tab daily for one week, then one eveyr other day for one week.  Other Orders: Surgical Referral (Surgery)  Patient Instructions: 1)  Please schedule a follow-up appointment in 3 weeks to see how she is doing.  2)  We will call with the labs and xray  results.  Prescriptions: PREDNISONE 20 MG TABS (PREDNISONE) 2 tabs daily for one week, then 1 tab daily for one week, 1/2 tab daily for one week, then one eveyr other day for one week.  #28 x 0   Entered and Authorized by:   Nani Gasser MD   Signed by:   Nani Gasser MD on 07/07/2010   Method used:   Electronically to        [facility] (retail)       [address]       [place], [place]  [address]       Ph: [phone]       Fax: [phone]   RxID:   [id number] LEXAPRO 10 MG TABS (ESCITALOPRAM OXALATE) Take 1 tablet by mouth once a day  #90 x 1   Entered and Authorized by:   Nani Gasser MD   Signed by:   Nani Gasser MD on 07/07/2010   Method used:   Electronically to  [facility]* (retail)             ,          Ph: [phone]       Fax: [phone]   RxID:   [id number] PREVACID 30 MG CPDR (LANSOPRAZOLE) Take 1 tablet by mouth once a day  #90 x 3   Entered and Authorized by:   Nani Gasser MD   Signed by:   Nani Gasser MD on 07/07/2010   Method used:   Electronically to        [facility]* (retail)             ,          Ph: [phone]       Fax: [phone]   RxID:   [id number] LISINOPRIL-HYDROCHLOROTHIAZIDE 20-12.5 MG TABS (LISINOPRIL-HYDROCHLOROTHIAZIDE) Take 1 tablet by mouth once a day  #90 x 3   Entered and Authorized by:   Nani Gasser MD   Signed by:   Nani Gasser MD on 07/07/2010   Method used:   Electronically to        [facility]* (retail)             ,          Ph: [phone]       Fax: [phone]   RxID:   [id number] BONIVA 150 MG TABS (IBANDRONATE SODIUM) once by mouth monthly  #3 x 3   Entered and Authorized by:   Nani Gasser MD   Signed by:   Nani Gasser MD on 07/07/2010   Method used:   Electronically to        [facility]* (retail)             ,          Ph: [phone]       Fax: [phone]   RxID:   [id number]    Orders  Added: 1)  T-Comprehensive Metabolic Panel [[id number]] 2)  T-Lipid Profile [[id number]] 3)  T-CBC w/Diff [[id number]] 4)  T-Chest x-ray, 2 views [71020] 5)  Surgical Referral [Surgery] 6)  Est. Patient Level IV [85277]

## 2010-07-17 ENCOUNTER — Ambulatory Visit (HOSPITAL_COMMUNITY): Payer: Medicare Other | Attending: Family Medicine

## 2010-07-17 ENCOUNTER — Encounter: Payer: Self-pay | Admitting: Cardiology

## 2010-07-17 ENCOUNTER — Institutional Professional Consult (permissible substitution) (INDEPENDENT_AMBULATORY_CARE_PROVIDER_SITE_OTHER): Payer: Medicare Other | Admitting: Cardiology

## 2010-07-17 DIAGNOSIS — R9431 Abnormal electrocardiogram [ECG] [EKG]: Secondary | ICD-10-CM | POA: Insufficient documentation

## 2010-07-17 DIAGNOSIS — R011 Cardiac murmur, unspecified: Secondary | ICD-10-CM

## 2010-07-17 DIAGNOSIS — J99 Respiratory disorders in diseases classified elsewhere: Secondary | ICD-10-CM | POA: Insufficient documentation

## 2010-07-17 DIAGNOSIS — R079 Chest pain, unspecified: Secondary | ICD-10-CM | POA: Insufficient documentation

## 2010-07-17 DIAGNOSIS — I080 Rheumatic disorders of both mitral and aortic valves: Secondary | ICD-10-CM | POA: Insufficient documentation

## 2010-07-17 DIAGNOSIS — R072 Precordial pain: Secondary | ICD-10-CM

## 2010-07-17 DIAGNOSIS — D869 Sarcoidosis, unspecified: Secondary | ICD-10-CM | POA: Insufficient documentation

## 2010-07-20 ENCOUNTER — Encounter: Payer: Self-pay | Admitting: Cardiology

## 2010-07-20 ENCOUNTER — Telehealth: Payer: Self-pay | Admitting: Cardiology

## 2010-07-21 ENCOUNTER — Telehealth: Payer: Self-pay | Admitting: Family Medicine

## 2010-07-21 LAB — CONVERTED CEMR LAB
Pro B Natriuretic peptide (BNP): <30 pg/mL (ref 0.0–100.0)
Total CK: 36 units/L (ref 7–177)
Troponin I: 0.02 ng/mL (ref <0.06)

## 2010-07-21 NOTE — Assessment & Plan Note (Signed)
Summary: SOB, atypical CP   Vital Signs:  Patient profile:   75 year old female Height:      61.5 inches Weight:      140 pounds O2 Sat:      98 % on Room air Temp:     98.1 degrees F oral Pulse rate:   80 / minute BP sitting:   156 / 83  (right arm) Cuff size:   regular  Vitals Entered By: Avon Gully CMA, (AAMA) (July 15, 2010 10:42 AM)  O2 Flow:  Room air CC: chest tightness and SOB   Primary Care Provider:  Nani Gasser MD  CC:  chest tightness and SOB.  History of Present Illness: Still feels SOB and has a raspy cough. Though initally she was better and the back pain is resolved.  CXR was normal.  More SOb with acitivity. NO fever.  STill on steroids.   the last couple days she has felt more of a central pressure in her chest. It does seem to come and go. It is not necessarily caused by activity but does seem to happen sometimes with activity. it can last for several minutes. she can remember the last time she had an EKG. She has had a history of mild palpitations in the past. no prior known cardiac disease.  Current Medications (verified): 1)  Boniva 150 Mg Tabs (Ibandronate Sodium) .... Once By Mouth Monthly 2)  Multivitamins Tabs (Multiple Vitamin) .... Once By Mouth Daily 3)  Feldene 10 Mg Caps (Piroxicam) .... Take 1 Tablet By Mouth Two Times A Day 4)  Lisinopril-Hydrochlorothiazide 20-12.5 Mg Tabs (Lisinopril-Hydrochlorothiazide) .... Take 1 Tablet By Mouth Once A Day 5)  Prevacid 30 Mg Cpdr (Lansoprazole) .... Take 1 Tablet By Mouth Once A Day 6)  Calcium With Vitamin D .... Take 1 Tablet By Mouth Two Times A Day 7)  Sustenex .... Take 1 Tablet By Mouth Once A Day 8)  Lexapro 10 Mg Tabs (Escitalopram Oxalate) .... Take 1 Tablet By Mouth Once A Day 9)  Simvastatin 20 Mg Tabs (Simvastatin) .... Take 1 Tablet By Mouth Once A Day At Bedtime 10)  Align  Caps (Probiotic Product) .Marland Kitchen.. 1 Tab By Mouth Once Daily 11)  Prednisone 20 Mg Tabs (Prednisone) .... 2 Tabs  Daily For One Week, Then 1 Tab Daily For One Week, 1/2 Tab Daily For One Week, Then One Eveyr Other Day For One Week.  Allergies (verified): No Known Drug Allergies  Comments:  Nurse/Medical Assistant: The patient's medications and allergies were reviewed with the patient and were updated in the Medication and Allergy Lists. Avon Gully CMA, Duncan Dull) (July 15, 2010 10:43 AM)  Past History:  Past Medical History: Last updated: 12/06/2009 Fatty liver on CT, G6 P3 A3(mis) Hx of sarcoidosis Lumbar stenosis at L4, L MRI 6-08; saw Dr Blanche East Anxiety Depression  Past Surgical History: Last updated: 03/01/2008 Bilat mastectomy-prophylactic 1975, Bilat oophorectomy  1982, carpel tunnel  1990, Cholecystectomy  1991, Hysterectomy , fibroids  1980, PFTs-no obstruction, Saline Breast Implants currently Partial colon resectin for diverticulitis  Family History: Last updated: 04/06/2006 BrCa_mother, 4 sisters, 3 neices  Social History: Last updated: 03/01/2008 Retired.  Associates degree.  Widowed.  Never smoked.  No EtOH, no drugs, 2 caffeinated drinks pr day.  No regular exercise. Grandaughter is Research scientist (physical sciences).   Physical Exam  General:  Well-developed,well-nourished,in no acute distress; alert,appropriate and cooperative throughout examination Head:  Normocephalic and atraumatic without obvious abnormalities. No apparent alopecia or balding. Eyes:  No corneal or conjunctival inflammation noted. EOMI. Perrla. Ears:  External ear exam shows no significant lesions or deformities.  Otoscopic examination reveals clear canals, tympanic membranes are intact bilaterally without bulging, retraction, inflammation or discharge. Hearing is grossly normal bilaterally. Nose:  External nasal examination shows no deformity or inflammation.  Mouth:  Oral mucosa and oropharynx without lesions or exudates.  Teeth in good repair. Neck:  No deformities, masses, or tenderness noted. Lungs:  Normal respiratory  effort, chest expands symmetrically. Lungs are clear to auscultation, no crackles or wheezes. Heart:  Normal rate and regular rhythm. S1 and S2 normal without gallop, murmur, click, rub or other extra sounds. Pulses:   radial pulse 2+ bilaterally Skin:  no rashes.   Cervical Nodes:  No lymphadenopathy noted Psych:  Cognition and judgment appear intact. Alert and cooperative with normal attention span and concentration. No apparent delusions, illusions, hallucinations   Impression & Recommendations:  Problem # 1:  CHEST PAIN, ATYPICAL (ICD-786.59)   I am concerned about her chest pain today. This is new. She has no known prior history of cardiac disease. I did perform an EKG today which showed left bundle branch block which is new. she had otherwise normal sinus rhythm with a rate of 72 beats per minute. uncle to refer her to cardiology and try to get her in either tomorrow or the next day. explained to her that we need to figure out why she has new onset left bundle branch block disc this could have started prior to her current chest pain. she did already have an EKG a few days ago which was normal. There is no sign of infection or infiltrate. I did ask her to go to the emergency room if the chest pain recurs and is severe or if she gets severely short of breath. or if she gets any numbness and tingling in her face jaw or arms.  Orders: Cardiology Referral (Cardiology) EKG w/ Interpretation (93[address])  Problem # 2:  ACUTE BRONCHITIS (ICD-466.0)   she is currently on steroids for her sarcoidosis as initially we thought that her cough and shortness of breath were related to that. as above she did have a normal chest x-ray. we could also consider putting her on an antibiotic since her cough seems to have gotten worse. she's not having any significant sinus symptoms today.  Complete Medication List: 1)  Boniva 150 Mg Tabs (Ibandronate sodium) .... Once by mouth monthly 2)  Multivitamins Tabs  (Multiple vitamin) .... Once by mouth daily 3)  Feldene 10 Mg Caps (Piroxicam) .... Take 1 tablet by mouth two times a day 4)  Lisinopril-hydrochlorothiazide 20-12.5 Mg Tabs (Lisinopril-hydrochlorothiazide) .... Take 1 tablet by mouth once a day 5)  Prevacid 30 Mg Cpdr (Lansoprazole) .... Take 1 tablet by mouth once a day 6)  Calcium With Vitamin D  .... Take 1 tablet by mouth two times a day 7)  Sustenex  .... Take 1 tablet by mouth once a day 8)  Lexapro 10 Mg Tabs (Escitalopram oxalate) .... Take 1 tablet by mouth once a day 9)  Simvastatin 20 Mg Tabs (Simvastatin) .... Take 1 tablet by mouth once a day at bedtime 10)  Align Caps (Probiotic product) .Marland Kitchen.. 1 tab by mouth once daily 11)  Prednisone 20 Mg Tabs (Prednisone) .... 2 tabs daily for one week, then 1 tab daily for one week, 1/2 tab daily for one week, then one eveyr other day for one week.  Patient Instructions: 1)  Left Bundle  Branch Block 2)  We will call you with the referral to Cardiology.    Orders Added: 1)  Cardiology Referral [Cardiology] 2)  Est. Patient Level IV [[address]] 3)  EKG w/ Interpretation [93000]  Appended Document: SOB, atypical CP  she really doesn't want to take her statin after her new findings on her EKG I strongly encouraged her to take it regularly and explained to her that the cardiologist will most likely highly encourage this as well. in fact her dose may need to be increased but she takes it inconsistently  so she might actually be well-controlled on a consistent dose.

## 2010-07-21 NOTE — Miscellaneous (Signed)
Summary: Orders Update  Clinical Lists Changes  Problems: Added new problem of NONSPECIFIC ABNORMAL ELECTROCARDIOGRAM (ICD-794.31) Orders: Added new Test order of T-BNP  (B Natriuretic Peptide) [phone]) - Signed Added new Test order of T-Troponin I ([id number]) - Signed Added new Test order of T-CK Total [phone]) - Signed

## 2010-07-28 NOTE — Assessment & Plan Note (Signed)
Summary: consult: chestpain. atypical. pt has bcbs. per jennifer offic...   Visit Type:  Follow-up Primary Provider:  Nani Gasser MD  CC:  Dyspnea.  History of Present Illness: The patient presents for evaluation of dyspnea. She has a history of sarcoidosis which has not been active. However, about a week ago she started having some cough and back pain. She was treated with steroids. She subsequently on Wednesday of this week after initial improvement she began to have aching in her back and chest. This has been mild. There has been no neck or arm discomfort.  She has had no associated nausea vomiting or diaphoresis. She hasn't noticed any palpitations, presyncope or syncope. She doesn't have any fevers or chills. Cough has been nonproductive.  She has not been pushing herself. She's not sure whether it is reproducible with activity. It has been about 3/10 at its peak. It seems to be somewhat constant. She is not reporting any palpitations, presyncope or syncope. She has had no PND or orthopnea.  Of note the patient had a bundle branch block on EKG. This apparently was new. I did review previous chest CT done which incidentally in 2001 demonstrated some coronary calcification. She has otherwise never had a diagnosis of vascular disease.  Current Medications (verified): 1)  Boniva 150 Mg Tabs (Ibandronate Sodium) .... Once By Mouth Monthly 2)  Multivitamins Tabs (Multiple Vitamin) .... Once By Mouth Daily 3)  Feldene 10 Mg Caps (Piroxicam) .... Take 1 Tablet By Mouth Two Times A Day 4)  Lisinopril-Hydrochlorothiazide 20-12.5 Mg Tabs (Lisinopril-Hydrochlorothiazide) .... Take 1 Tablet By Mouth Once A Day 5)  Prevacid 30 Mg Cpdr (Lansoprazole) .... Take 1 Tablet By Mouth Once A Day 6)  Calcium With Vitamin D .... Take 1 Tablet By Mouth Two Times A Day 7)  Sustenex .... Take 1 Tablet By Mouth Once A Day 8)  Lexapro 10 Mg Tabs (Escitalopram Oxalate) .... Take 1 Tablet By Mouth Once A Day 9)   Simvastatin 20 Mg Tabs (Simvastatin) .... Take 1 Tablet By Mouth Once A Day At Bedtime 10)  Align  Caps (Probiotic Product) .Marland Kitchen.. 1 Tab By Mouth Once Daily 11)  Prednisone 20 Mg Tabs (Prednisone) .... 2 Tabs Daily For One Week, Then 1 Tab Daily For One Week, 1/2 Tab Daily For One Week, Then One Eveyr Other Day For One Week.  Allergies (verified): No Known Drug Allergies  Past History:  Past Medical History: Last updated: 07/16/2010 CHEST PAIN, ATYPICAL  LBBB  ACUTE BRONCHITIS DEPRESSION ANXIETY STATE, UNSPECIFIED SHOULDER PAIN BENIGN POSITIONAL VERTIGO EXTERNAL HEMORRHOIDS  BACK PAIN, UPPER  SOB  OSTEOPOROSIS  ESSENTIAL HYPERTENSION, BENIGN DEPRESSIVE DISORDER, MAJOR, RCR, MILD  ALLERGIC RHINITIS OSTEOARTHROSIS, GENERALIZED, MULTIPLE SITES  HYPERTRIGLYCERIDEMIA  DIVERTICULITIS OF COLON, NOS  Fatty liver on CT, G6 P3 A3(mis) Hx of sarcoidosis Lumbar stenosis at L4, L MRI 6-08; saw Dr Blanche East  Past Surgical History: Reviewed history from 03/01/2008 and no changes required. Bilat mastectomy-prophylactic 1975, Bilat oophorectomy  1982, carpel tunnel  1990, Cholecystectomy  1991, Hysterectomy , fibroids  1980, PFTs-no obstruction, [facility] Implants currently Partial colon resectin for diverticulitis  Family History: Reviewed history from 04/06/2006 and no changes required. BrCa_mother, 4 sisters, 3 neices No early heart disease  Social History: Reviewed history from 03/01/2008 and no changes required. Retired.  Associates degree.  Widowed.  Never smoked.  No EtOH, no drugs, 2 caffeinated drinks pr day.  No regular exercise. Grandaughter is Research scientist (physical sciences).   Review of Systems  Positive for reflux. Otherwise as stated in the history of present illness negative for all other systems  Vital Signs:  Patient profile:   75 year old female Height:      61.5 inches Weight:      139 pounds BMI:     25.93 Pulse rate:   79 / minute Resp:     16 per minute BP sitting:   140 /  75  (right arm)  Vitals Entered By: Marrion Coy, CNA (July 17, 2010 1:18 PM)  Physical Exam  General:  Well developed, well nourished, in no acute distress. Head:  normocephalic and atraumatic Eyes:  PERRLA/EOM intact; conjunctiva and lids normal. Mouth:  Teeth, gums and palate normal. Oral mucosa normal. Neck:  Neck supple, no JVD. No masses, thyromegaly or abnormal cervical nodes. Chest Wall:  no deformities or breast masses noted Lungs:  Clear bilaterally to auscultation and percussion. Abdomen:  Bowel sounds positive; abdomen soft and non-tender without masses, organomegaly, or hernias noted. No hepatosplenomegaly. Msk:  Back normal, normal gait. Muscle strength and tone normal. Extremities:  No clubbing or cyanosis. Neurologic:  Alert and oriented x 3. Skin:  Intact without lesions or rashes. Cervical Nodes:  no significant adenopathy Axillary Nodes:  no significant adenopathy Inguinal Nodes:  no significant adenopathy Psych:  Normal affect.   Detailed Cardiovascular Exam  Neck    Carotids: Carotids full and equal bilaterally without bruits.      Neck Veins: Normal, no JVD.    Heart    Inspection: no deformities or lifts noted.      Palpation: normal PMI with no thrills palpable.      Auscultation: regular rate and rhythm, S1, S2 with 2/6 systolic murmur heard at the left upper sternal border.  Vascular    Abdominal Aorta: no palpable masses, pulsations, or audible bruits.      Femoral Pulses: normal femoral pulses bilaterally.      Pedal Pulses: normal pedal pulses bilaterally.      Radial Pulses: normal radial pulses bilaterally.      Peripheral Circulation: no clubbing, cyanosis, or edema noted with normal capillary refill.     EKG  Procedure date:  07/17/2010  Findings:      Sinus rhythm, left bundle branch block, left axis deviation  Impression & Recommendations:  Problem # 1:  CHEST PAIN, ATYPICAL (ICD-786.59) The patient's chest pain is somewhat  atypical. I do not strongly suspect a recent coronary event. However, I did check cardiac enzymes which are back to normal. A BNP is normal as well. I will check an echocardiogram. If there are are no longer motion abnormalities I will plan a stress perfusion study. Of note very likely has CAD with the coronary calcification.  The question is obstructive or not.  Problem # 2:  ABNORMAL ELECTROCARDIOGRAM (ICD-794.31) There are no EKGs for comparison I can find. The evaluation is as above. Orders: EKG w/ Interpretation (93000)  Problem # 3:  ESSENTIAL HYPERTENSION, BENIGN (ICD-401.1) Her blood pressure is acceptable. I will not make any changes to her meds though she needs to keep an eye on this.  Problem # 4:  SARCOIDOSIS (ICD-135) This will be managed per her primary provider.  Problem # 5:  HYPERTRIGLYCERIDEMIA (ICD-272.1) I reveiwed her lipids.  With coronary calcification I would suggest a goal  LDL of less than 100.  Hers was 128.  I will ask her to increase her Zocor to 40 mg.  Other Orders: Echocardiogram (Echo)

## 2010-07-28 NOTE — Miscellaneous (Signed)
Summary: order of myoview  Clinical Lists Changes  Orders: Added new Referral order of Nuclear Stress Test (Nuc Stress Test) - Signed

## 2010-07-28 NOTE — Progress Notes (Signed)
Summary: Cough getting worse  Phone Note Call from Patient Call back at Home Phone [phone]   Caller: Patient Summary of Call: pt had appt on 3/7 for shortness of breath and cough.  Pt states her cough has not gotten better, it is getting worse.  Pt wants to know if we can call in something for the cough. Initial call taken by: Francee Piccolo CMA Duncan Dull),  July 21, 2010 2:21 PM  Follow-up for Phone Call        2 ocugh meds and ABX sent to pharm.  Follow-up by: Nani Gasser MD,  July 21, 2010 4:00 PM  Additional Follow-up for Phone Call Additional follow up Details #1::        pt notified Additional Follow-up by: Avon Gully CMA, Duncan Dull),  July 21, 2010 5:03 PM    New/Updated Medications: ZITHROMAX Z-PAK 250 MG TABS (AZITHROMYCIN) TAke as directed. HYDROCODONE-HOMATROPINE 5-1.5 MG/5ML SYRP (HYDROCODONE-HOMATROPINE) 5ml by mouth at bedtime as needed cough TESSALON 200 MG CAPS (BENZONATATE) one by mouth three times a day as needed cough Prescriptions: TESSALON 200 MG CAPS (BENZONATATE) one by mouth three times a day as needed cough  #60 x 0   Entered and Authorized by:   Nani Gasser MD   Signed by:   Nani Gasser MD on 07/21/2010   Method used:   Electronically to        [facility]* (retail)       [address]       [place], [place]  [address]       Ph: [phone]       Fax: [phone]   RxID:   [id number] HYDROCODONE-HOMATROPINE 5-1.5 MG/5ML SYRP (HYDROCODONE-HOMATROPINE) 5ml by mouth at bedtime as needed cough  #138ml x 0   Entered and Authorized by:   Nani Gasser MD   Signed by:   Nani Gasser MD on 07/21/2010   Method used:   Printed then faxed to ...       [facility]* (retail)       [address]       [place], [place]  [address]       Ph: [phone]       Fax: [phone]   RxID:   [id number] ZITHROMAX Z-PAK 250 MG TABS (AZITHROMYCIN) TAke as directed.  #1 pack x 0   Entered and Authorized by:   Nani Gasser  MD   Signed by:   Nani Gasser MD on 07/21/2010   Method used:   Electronically to        [facility] (retail)       [address]       [place], [place]  [address]       Ph: [phone]       Fax: [phone]   RxID:   [id number]

## 2010-07-28 NOTE — Progress Notes (Signed)
Summary: test results  Phone Note Call from Patient Call back at Home Phone [phone]   Caller: Patient Reason for Call: Lab or Test Results Initial call taken by: Judie Grieve,  July 20, 2010 2:06 PM     Appended Document: test results pt called with results of echo and labs.  She is aware she will be called to schedule myoview

## 2010-07-30 ENCOUNTER — Encounter: Payer: Self-pay | Admitting: Family Medicine

## 2010-07-30 ENCOUNTER — Ambulatory Visit
Admission: RE | Admit: 2010-07-30 | Discharge: 2010-07-30 | Disposition: A | Discharge status: Home / Self Care | Payer: Medicare Other | Source: Ambulatory Visit | Attending: Family Medicine | Admitting: Family Medicine

## 2010-07-30 ENCOUNTER — Ambulatory Visit (INDEPENDENT_AMBULATORY_CARE_PROVIDER_SITE_OTHER): Payer: Medicare Other | Admitting: Family Medicine

## 2010-07-30 VITALS — BP 106/62 | HR 106 | Ht 61.5 in | Wt 139.0 lb

## 2010-07-30 DIAGNOSIS — M549 Dorsalgia, unspecified: Secondary | ICD-10-CM

## 2010-07-30 DIAGNOSIS — D869 Sarcoidosis, unspecified: Secondary | ICD-10-CM

## 2010-07-30 DIAGNOSIS — M546 Pain in thoracic spine: Secondary | ICD-10-CM

## 2010-07-30 NOTE — Assessment & Plan Note (Signed)
I'm not sure if the shortness of breath is really her sarcoidosis or not. She is currently under workup for her new recent onset bundle branch block and has a stress test scheduled for next week. At this point I'm referring her back to Dr. Fannie Knee who saw her years ago for her sarcoid. She's completed her antibiotics and I do not think she would benefit from a second round at this point , I will leave this up to the pulmonologist. She also did not improve while on steroids at this point I'm not going to repeat a dose. She also does not like how she feels on steroids.

## 2010-07-30 NOTE — Patient Instructions (Signed)
We will call you with the referral information.  Can use your piroxicam for your back pain.

## 2010-07-30 NOTE — Assessment & Plan Note (Signed)
Xray today. I really think her back pain is separate form her SOB.  If neg consider MRI since continues to have pain. Her pain is improved with piroxicam so it can be used.   Can also consider exercises to try improve her pain.

## 2010-07-30 NOTE — Progress Notes (Signed)
  Subjective:    Patient ID: Deanna Wilson, female    DOB: July 03, 1935, 75 y.o.   MRN: [id number]  HPI Still feels SOB.  Still with occ productive cough, thought now white instead of green. Had an echo for new onset BBB. Has a stress test is next week. Completed the abx and cough meds. Had neg CXR. Feels completely exhausted and having some upper back bain. BAck pain is better if she flexes forward. Sometime piroxicam helps the back pain. Did have some back pain with her first episdode of saracoidosis.  BAck is also better when  Lays down.  No numbness or tingling in the hands or arm. Occ radiates towards her neck.  Back pain for at least 6 weeks.   Review of Systems     Objective:   Physical Exam  Constitutional: She appears well-developed and well-nourished.  Cardiovascular: Normal rate, regular rhythm and normal heart sounds.   Pulmonary/Chest: Effort normal and breath sounds normal.  Musculoskeletal:       Thoracic back: She exhibits pain. She exhibits normal range of motion, no tenderness, no bony tenderness, no deformity and no spasm.       Back:          Assessment & Plan:

## 2010-07-31 ENCOUNTER — Telehealth: Payer: Self-pay | Admitting: *Deleted

## 2010-07-31 DIAGNOSIS — M546 Pain in thoracic spine: Secondary | ICD-10-CM

## 2010-07-31 NOTE — Telephone Encounter (Signed)
Called pt and notified of results an pt is ok with doing a MRI

## 2010-08-03 ENCOUNTER — Ambulatory Visit (HOSPITAL_COMMUNITY): Payer: Medicare Other | Attending: Cardiology | Admitting: Radiology

## 2010-08-03 DIAGNOSIS — R0789 Other chest pain: Secondary | ICD-10-CM

## 2010-08-03 DIAGNOSIS — I447 Left bundle-branch block, unspecified: Secondary | ICD-10-CM

## 2010-08-03 DIAGNOSIS — R0989 Other specified symptoms and signs involving the circulatory and respiratory systems: Secondary | ICD-10-CM

## 2010-08-03 MED ORDER — ADENOSINE (DIAGNOSTIC) 3 MG/ML IV SOLN
0.5600 mg/kg | Freq: Once | INTRAVENOUS | Status: AC
  Administered 2010-08-03: 34.8 mg via INTRAVENOUS

## 2010-08-03 MED ORDER — TECHNETIUM TC 99M TETROFOSMIN IV KIT
33.0000 | PACK | Freq: Once | INTRAVENOUS | Status: AC | PRN
  Administered 2010-08-03: 33 via INTRAVENOUS

## 2010-08-03 MED ORDER — TECHNETIUM TC 99M TETROFOSMIN IV KIT
11.0000 | PACK | Freq: Once | INTRAVENOUS | Status: AC | PRN
  Administered 2010-08-03: 11 via INTRAVENOUS

## 2010-08-03 NOTE — Progress Notes (Addendum)
[facility] 3 NUCLEAR MED [address] [place] [address] [phone]  Cardiology Nuclear Med Study Deanna Wilson female [birth year]   Nuclear Med Background Indication for Stress Test:  Evaluation for Ischemia, Abnormal EKG and New LBBB History:  COPD, ~10 yrs ago GXT:OK per pt, "had LBBB"; '01 Cardiac NF:AOZHYQMV calcification; 3/12 Echo:EF=65-70%, mild MR. Cardiac Risk Factors: Hypertension and Lipids  Symptoms:  Chest Pain/Tightness (last date of chest discomfort was yesterday), DOE, Fatigue, Palpitations, Rapid HR and SOB   Nuclear Pre-Procedure Caffeine/Decaff Intake:  None NPO After: 6:00am   Lungs:  Clear.  O2 sat 99% on RA IV 0.9% NS with Angio Cath:  22g  IV Site: R Antecubital  IV Started by:  Irean Hong, RN  Chest Size (in):  38 Cup Size:A;  Breast Implants  Height: 5' 1.5" (1.562 m)  Weight:  137 lb (62.143 kg)  BMI:  Body mass index is 25.47 kg/(m^2). Tech Comments:      Nuclear Med Study 1 or 2 day study: 1 day  Stress Test Type:  Adenosine  Reading MD: Kristeen Miss, MD  Order Authorizing Provider:  Rollene Rotunda, MD  Resting Radionuclide: Technetium 32m Tetrofosmin  Resting Radionuclide Dose: 11 mCi   Stress Radionuclide:  Technetium 57m Tetrofosmin  Stress Radionuclide Dose: 33 mCi           Stress Protocol Rest HR: 68 Stress HR: 100  Rest BP: 131/74 Stress BP: 124/63  Exercise Time:  n/a METS: n/a  Predicted HR: n/a % of Maximum: n/a        Dose of Adenosine: 34.9 mg Dose of Lexiscan:  n/a  Dose of Atropine:  n/a Dose of Dobutamine:  n/a  Stress Test Technologist: [employer], CMA-N  Nuclear Technologist:  Domenic Polite, CNMT     Rest Procedure:  Myocardial perfusion imaging was performed at rest 45 minutes following the intravenous administration of Technetium 51m Tetrofosmin. Rest ECG: LBBB  Stress Procedure:  The patient received IV adenosine at 140 mcg/kg/min for 4 minutes.  There was a brief episode  of 2nd degree AVB with infusion.  Technetium 26m Tetrofosmin was injected at the 2 minute mark and quantitative spect images were obtained after a 45 minute delay. Stress ECG: There was a brief episode of 2nd degree AVB with infusion.  QPS Raw Data Images:  Normal; no motion artifact; normal heart/lung ratio. Stress Images:  Normal homogeneous uptake in all areas of the myocardium. Rest Images:  Normal homogeneous uptake in all areas of the myocardium. Subtraction (SDS):  No evidence of ischemia.  Findings Risk Category:  Normal nuclear study. Clinically Abnormal:  No Ischemia:  No Fixed Defect:  No LV Dysfunction:  No Transient Ischemic Dilatation (Normal <1.22):  1.19 Lung/Heart Ratio (Normal <0.45):  .43  Quantitative Gated Spect Images QGS EDV:  46 ml QGS ESV:  11 ml QGS cine images:  There is normal LV function. QGS EF:  77 %  Impression Exercise Capacity:  Adenosine study with no exercise. BP Response:  Normal blood pressure response. Clinical Symptoms:  No chest pain. ECG Impression:  No significant ST segment change suggestive of ischemia. Comparison with Prior Nuclear Study: No previous nuclear study performed  Overall Impression:  Normal stress nuclear study.  No evidence of ischemia.  Normal LV function.   Patient aware of results and plans for follow up.  No evidence of obstructive CAD although she clearly has some plaque with coronary calcification noted.  She needs aggressive  primary risk reduction.

## 2010-08-04 ENCOUNTER — Encounter (HOSPITAL_COMMUNITY): Payer: Self-pay | Admitting: Radiology

## 2010-08-04 ENCOUNTER — Telehealth: Payer: Self-pay | Admitting: *Deleted

## 2010-08-04 NOTE — Telephone Encounter (Signed)
Message copied by Avon Gully on Tue Aug 04, 2010  5:02 PM ------      Message from: Nani Gasser      Created: Fri Jul 31, 2010  8:11 AM       Call pt: Showed some arthritis changes but nothing else major. Will try to move forward with MRI if OK with that. Just let me know.

## 2010-08-06 ENCOUNTER — Telehealth: Payer: Self-pay | Admitting: *Deleted

## 2010-08-06 DIAGNOSIS — M546 Pain in thoracic spine: Secondary | ICD-10-CM

## 2010-08-06 NOTE — Telephone Encounter (Signed)
Pt calling to remind you to put in referral to Dr. Maple Hudson. Please advise.

## 2010-08-06 NOTE — Telephone Encounter (Signed)
Pt notified and is ok with doing an MRI

## 2010-08-06 NOTE — Telephone Encounter (Signed)
Message copied by Avon Gully on Thu Aug 06, 2010  4:20 PM ------      Message from: Nani Gasser      Created: Fri Jul 31, 2010  8:11 AM       Call pt: Showed some arthritis changes but nothing else major. Will try to move forward with MRI if OK with that. Just let me know.

## 2010-08-07 ENCOUNTER — Encounter: Payer: Self-pay | Admitting: Internal Medicine

## 2010-08-07 ENCOUNTER — Other Ambulatory Visit: Payer: Self-pay | Admitting: Internal Medicine

## 2010-08-07 ENCOUNTER — Telehealth: Payer: Self-pay | Admitting: Cardiology

## 2010-08-07 ENCOUNTER — Encounter: Payer: Self-pay | Admitting: Family Medicine

## 2010-08-07 ENCOUNTER — Ambulatory Visit (INDEPENDENT_AMBULATORY_CARE_PROVIDER_SITE_OTHER): Payer: Medicare Other | Admitting: Internal Medicine

## 2010-08-07 ENCOUNTER — Other Ambulatory Visit: Payer: Medicare Other

## 2010-08-07 VITALS — BP 112/68 | HR 102 | Ht 60.0 in | Wt 140.8 lb

## 2010-08-07 DIAGNOSIS — D869 Sarcoidosis, unspecified: Secondary | ICD-10-CM

## 2010-08-07 DIAGNOSIS — I2699 Other pulmonary embolism without acute cor pulmonale: Secondary | ICD-10-CM

## 2010-08-07 DIAGNOSIS — M549 Dorsalgia, unspecified: Secondary | ICD-10-CM

## 2010-08-07 DIAGNOSIS — R0602 Shortness of breath: Secondary | ICD-10-CM

## 2010-08-07 NOTE — Telephone Encounter (Signed)
Pt wants stress test results

## 2010-08-07 NOTE — Progress Notes (Signed)
Subjective:    Patient ID: Deanna Wilson, female    DOB: [birth year], 75 y.o.   MRN: [id number]  HPI 75 yo never smoker, referred courtesy of Dr Linford Arnold with concern of dyspnea. I had seen her with dx of Sarcoid by mediastinoscopy in 1999, treated with prednisone. ACE of 60 (9-67) on 06/04/05.Marland Kitchen She had pulmonary embolism from unknown source in 2004/ coumadin x 6 months. She was concerned about dyspnea in 2006, when PFT was normal except for DLCO of 68% on 03/04/05. She now complains of dyspnea x 2 months, mostly with exertion such as walking. Through the past winter she hadn't felt well, with some cough and pains around mid thoracic back at strap level. Occasional night sweats. Denies rash, nodes. Around the first of March, 2012 she got worse with fatigue such that she stopped water aerobics. Took prednisone 10 day taper, finished 2 weeks ago- she doesn't think it helped. She had ECHO and recent stress test- reporting bundle branch block,otw " My heart is ok".  CXR 07/01/09 reviewed w /her- Stable mild chronic lung disease, no nodule, NAD, with osteophytes on T-spine.In Fall, 2011 she says she had a Flu syndrome. CXR then CT showed a benign nodule.  She says she feels "tired" not sleepy, just no energy. Denies cough, wheeze, angina or pleuritic pain.    Review of Systems See HPI Constitutional:   No weight loss, night sweats,  Fevers, chills, , lassitude. HEENT:   No headaches,  Difficulty swallowing,  Tooth/dental problems,  Sore throat,                No sneezing, itching, ear ache, nasal congestion, post nasal drip,   CV:  No chest pain,  Orthopnea, PND, swelling in lower extremities, anasarca, dizziness, palpitations  GI  No heartburn, indigestion, abdominal pain, nausea, vomiting, diarrhea, change in bowel habits, loss of appetite  Resp: .  No excess mucus, no productive cough,  No non-productive cough,  No coughing up of blood.  No change in color of mucus.  No wheezing.   Skin: no rash  or lesions.  GU: no dysuria, change in color of urine, no urgency or frequency.  No flank pain.  MS:  No joint pain or swelling.  No decreased range of motion. Marland Kitchen  Psych:  No change in mood or affect. No depression or anxiety.  No memory loss.     Objective:   Physical Exam General- Alert, Oriented, Affect-appropriate, Distress- none acute  Skin- rash-none, lesions- none, excoriation- none  Lymphadenopathy- none  Head- atraumatic  Eyes- Gross vision intact, PERRLA, conjunctivae clear, secretions  Ears- normal Hearing, canals, Tm L ,   R ,  Nose- Clear, No- Septal dev, mucus, polyps, erosion, perforation   Throat- Mallampati II , mucosa clear , drainage- none, tonsils- atrophic              Mediastinoscopy scar  Neck- flexible , trachea midline, no stridor , thyroid nl, carotid no bruit  Chest - symmetrical excursion , unlabored, breast reconstruction     Heart/CV- RRR , no murmur , no gallop  , no rub, nl s1 s2                     - JVD- none , edema- none, stasis changes- none, varices- none     Lung- clear to P&A, wheeze- none, cough- none , dullness-none, rub- none     Chest wall-   Abd- tender-no, distended-no, bowel sounds-present, HSM-  no  Br/ Gen/ Rectal- Not done, not indicated  Extrem- cyanosis- none, clubbing, none, atrophy- none, strength- nl, Neg Homan's  Neuro- grossly intact to observation         Assessment & Plan:

## 2010-08-07 NOTE — Patient Instructions (Addendum)
Orders-  Schedule PFT and 6  MWT                Lab- D-dimer, Angiotensin Converting Enzyme Level for dx Sarcoid  Cc Dr Linford Arnold

## 2010-08-07 NOTE — Telephone Encounter (Signed)
Pt aware of results and aware of need to follow up with Dr Antoine Poche in 2 months

## 2010-08-07 NOTE — Assessment & Plan Note (Signed)
Complaint is of fatigue associated with bilateral back pain at strap level. The pain may be related to her known spinal osteophytes.  DDX for the "tirednes"' in this context would include cardiac factors being assess by cardiology, anemia excluded by recent labs. Doubt recurrent PE but will check D-dimer. Will check for ACE level elevation and get an updated PFT. Sarcoid reactivation is possible, but CT was clear.

## 2010-08-08 LAB — D-DIMER, QUANTITATIVE: D-Dimer, Quant: 0.45 ug/mL-FEU (ref 0.00–0.48)

## 2010-08-09 ENCOUNTER — Encounter: Payer: Self-pay | Admitting: Internal Medicine

## 2010-08-09 DIAGNOSIS — I2699 Other pulmonary embolism without acute cor pulmonale: Secondary | ICD-10-CM | POA: Insufficient documentation

## 2010-08-09 NOTE — Assessment & Plan Note (Signed)
Known osteophytes. Pain of this character is common with vertebral compression fx's. I don't think it is causing dyspnea.

## 2010-08-09 NOTE — Assessment & Plan Note (Signed)
DDX needs to include question of recurrence of either Sarcoid or PE. Recurrent pulmonary sarcoid at her age would be unusual. We will recheck ACE, but there have not been corresponding changes described on imaging. PE needs to be ruled out- we can start with D-dimer. Leg exam is not suspicious, but her original source was never defined. I don't think she is significantly anemic. Cardiac exam has been done. Plan PFT, 6 MWT, review of imaging.

## 2010-08-17 NOTE — Progress Notes (Signed)
Quick Note:  Left message with young girl at home number to have patient return my call to review results with her. ______

## 2010-08-20 ENCOUNTER — Telehealth: Payer: Self-pay | Admitting: Internal Medicine

## 2010-08-20 NOTE — Telephone Encounter (Signed)
Pt returning call.Deanna Wilson ° °

## 2010-08-20 NOTE — Telephone Encounter (Signed)
Left message for patient to call me back and speak directly with me regarding her lab results.

## 2010-08-21 NOTE — Progress Notes (Signed)
Quick Note:  I have left message on pts cell voicemail that labs are ok. ______

## 2010-08-21 NOTE — Telephone Encounter (Signed)
I have left a message for pt to call me back-I have spoke with the front staff to get me personally when the pt returns my call again.Deanna Wilson

## 2010-08-21 NOTE — Telephone Encounter (Signed)
I left message on voicemail that labs are ok and to call the office if any questions or concerns.

## 2010-08-21 NOTE — Telephone Encounter (Signed)
Will forward to Katie since pt tried calling back for her

## 2010-08-24 ENCOUNTER — Ambulatory Visit: Payer: Medicare Other

## 2010-08-25 ENCOUNTER — Ambulatory Visit: Payer: Medicare Other

## 2010-08-26 ENCOUNTER — Ambulatory Visit (INDEPENDENT_AMBULATORY_CARE_PROVIDER_SITE_OTHER): Payer: Medicare Other | Admitting: Internal Medicine

## 2010-08-26 DIAGNOSIS — R0609 Other forms of dyspnea: Secondary | ICD-10-CM

## 2010-08-26 DIAGNOSIS — R0989 Other specified symptoms and signs involving the circulatory and respiratory systems: Secondary | ICD-10-CM

## 2010-08-26 DIAGNOSIS — R0602 Shortness of breath: Secondary | ICD-10-CM

## 2010-08-26 DIAGNOSIS — D869 Sarcoidosis, unspecified: Secondary | ICD-10-CM

## 2010-08-26 LAB — PULMONARY FUNCTION TEST

## 2010-08-26 NOTE — Progress Notes (Signed)
PFT done today. 

## 2010-09-01 ENCOUNTER — Encounter: Payer: Self-pay | Admitting: Internal Medicine

## 2010-09-05 ENCOUNTER — Ambulatory Visit
Admission: RE | Admit: 2010-09-05 | Discharge: 2010-09-05 | Disposition: A | Discharge status: Home / Self Care | Payer: Medicare Other | Source: Ambulatory Visit | Attending: Family Medicine | Admitting: Family Medicine

## 2010-09-05 DIAGNOSIS — M546 Pain in thoracic spine: Secondary | ICD-10-CM

## 2010-09-15 ENCOUNTER — Telehealth: Payer: Self-pay | Admitting: Family Medicine

## 2010-09-15 NOTE — Telephone Encounter (Signed)
Please apologize to her. Not sure why I didn't get these result:   Degenerative disc disease with foraminal and central impingement at C5-6 and C6-7. Degenerative disc disease in the thoracic spine at T8-9, T11-  12, and T12-L1, but without impingement at these levels. I recommend she consider seeing neurosurgeon or orhopedics.

## 2010-09-15 NOTE — Telephone Encounter (Signed)
Pt notified and pt states she will call us back when she decides what she wants to do

## 2010-09-15 NOTE — Telephone Encounter (Signed)
Pt called and would like MRI results from 2 weeks ago.  Looks like the report is obtained in the system.  Before I give the results to the patient I would like you Dr. Linford Arnold to maybe suggest what I say to the patient.  Thanks  Plan:  Routed to  [facility] [place]

## 2010-09-22 ENCOUNTER — Ambulatory Visit (INDEPENDENT_AMBULATORY_CARE_PROVIDER_SITE_OTHER): Payer: Medicare Other | Admitting: Internal Medicine

## 2010-09-22 ENCOUNTER — Encounter: Payer: Self-pay | Admitting: Internal Medicine

## 2010-09-22 VITALS — BP 108/76 | HR 93 | Ht 60.0 in | Wt 141.0 lb

## 2010-09-22 DIAGNOSIS — R0602 Shortness of breath: Secondary | ICD-10-CM

## 2010-09-22 DIAGNOSIS — D869 Sarcoidosis, unspecified: Secondary | ICD-10-CM

## 2010-09-22 NOTE — Progress Notes (Signed)
Subjective:     Patient ID: Deanna Wilson, female   DOB: [birth year], 75 y.o.   MRN: [id number]  HPI 09/22/10- 89 yoF never smoker followed for sarcoid with hx pulmonary embolism. Last here 08/09/10 for concern of dyspnea on referral from Dr Linford Arnold with remote hx of sarcoid, treated with prednisone in 1999. Note reviewed.  She had felt worse after trial of prednisone that didn't seem to help, then had some palpitation. All of that has resolved. She has had similar feeling of malaise at times in past, blamed on sarcoid. Currently without fever, sweats, cough or wheeze, pain or swelling.  PFT 08/26/10- Diffusion reduced, 69%, otherwise flows and volumes normal.  ACE WNL  1 D-dimer- WNL 0.45.  Review of Systems Constitutional:   No weight loss, night sweats, Fevers, chills, fatigue, lassitude. HEENT:   No headaches,  Difficulty swallowing,  Tooth/dental problems,  Sore throat,                No sneezing, itching, ear ache, nasal congestion, post nasal drip,   CV:  No chest pain,  Orthopnea, PND, swelling in lower extremities, anasarca, dizziness, palpitations  GI  No heartburn, indigestion, abdominal pain, nausea, vomiting, diarrhea, change in bowel habits, loss of appetite  Resp: .  No excess mucus, no productive cough,  No non-productive cough,  No coughing up of blood.  No change in color of mucus.  No wheezing.    Skin: no rash or lesions.  GU: no dysuria, change in color of urine, no urgency or frequency.  No flank pain.  MS:  No joint pain or swelling.  No decreased range of motion.  No back pain.  Psych:  No change in mood or affect. No depression or anxiety.  No memory loss.      Objective:   Physical Exam General- Alert, Oriented, Affect-appropriate, Distress- none acute  Skin- rash-none, lesions- none, excoriation- none  Lymphadenopathy- none  Head- atraumatic  Eyes- Gross vision intact, PERRLA, conjunctivae clear secretions  Ears- Hearing, canals,- normal,  Nose-  Clear, Septal dev, mucus, polyps, erosion, perforation   Throat- Mallampati II , mucosa clear , drainage- none, tonsils- atrophic  Neck- flexible , trachea midline, no stridor , thyroid nl, carotid no bruit  Chest - symmetrical excursion , unlabored     Heart/CV- RRR , no murmur , no gallop  , no rub, nl s1 s2                     - JVD- none , edema- none, stasis changes- none, varices- none     Lung- clear to P&A, wheeze- none, cough- none , dullness-none, rub- none           Minor throat clearing type cough  Chest wall- Abd- tender-no, distended-no, bowel sounds-present, HSM- no  Br/ Gen/ Rectal- Not done, not indicated  Extrem- cyanosis- none, clubbing, none, atrophy- none, strength- nl  Neuro- grossly intact to observation      Assessment:         Plan:

## 2010-09-22 NOTE — Patient Instructions (Signed)
Sample rescue inhaler for trial- 2 puffs up to 4 times daily if needed for shortness of breath or chest tightness

## 2010-09-22 NOTE — Assessment & Plan Note (Signed)
I doubt that she is feeling episodic flares of sarcoid at her age. Recognize that this ACE level was drawn after a course of prednisone.  He sense of dyspnea and weakness is vague. PFT doesn't show reactive airways sdiease, but she would like to try an inhaler and is given a sample

## 2010-09-26 ENCOUNTER — Encounter: Payer: Self-pay | Admitting: Internal Medicine

## 2011-01-07 ENCOUNTER — Ambulatory Visit: Payer: Medicare Other | Admitting: Family Medicine

## 2011-01-14 ENCOUNTER — Ambulatory Visit: Payer: Medicare Other | Admitting: Family Medicine

## 2011-01-28 ENCOUNTER — Telehealth: Payer: Self-pay | Admitting: Family Medicine

## 2011-01-28 NOTE — Telephone Encounter (Signed)
Referral entered for general surgery as ordered.  LMOM for the pt telling her someone should be calling her about the date/time of referral appt.  Jarvis Newcomer, LPN [facility]

## 2011-01-28 NOTE — Telephone Encounter (Signed)
OK to put in referral. Can schedule with surgeon downstiars from [facility].

## 2011-01-28 NOTE — Telephone Encounter (Signed)
Pt called and left mess stating she needed referral to surgeon for her hemorrhoids.  I do see where pt has hemorrhoids as a chronic diagnosis. Plan:  Routed to Dr. Linford Arnold to see if we need to see in the office before referring or if pt can automatically be referred.  Please advise. Jarvis Newcomer, LPN [facility]

## 2011-02-09 ENCOUNTER — Ambulatory Visit (INDEPENDENT_AMBULATORY_CARE_PROVIDER_SITE_OTHER): Payer: Self-pay | Admitting: Surgery

## 2011-02-09 ENCOUNTER — Encounter (INDEPENDENT_AMBULATORY_CARE_PROVIDER_SITE_OTHER): Payer: Self-pay | Admitting: Surgery

## 2011-02-09 DIAGNOSIS — K648 Other hemorrhoids: Secondary | ICD-10-CM | POA: Insufficient documentation

## 2011-02-09 DIAGNOSIS — K602 Anal fissure, unspecified: Secondary | ICD-10-CM | POA: Insufficient documentation

## 2011-02-09 NOTE — Progress Notes (Signed)
Chief Complaint  Patient presents with  . Hemorrhoids    referral from Dr. Nani Gasser    HISTORY: Patient is a 75 year old female referred by her primary care physician for hemorrhoids. Patient has had intermittent problems with hemorrhoids dating back several years. She has had some irregularity since undergoing colonic resection for diverticular disease 15 years ago. However, over the past few weeks, she has experienced increased pain, burning sensation, and significant bleeding. Patient does take fiber. She uses Tucks pads. She is having 4-5 bowel movements daily. Patient presents for evaluation for anorectal pain and bleeding. Patient denies any previous anorectal surgery. Her last colonoscopy was approximately 10 years ago.   Past Medical History  Diagnosis Date  . Chest pain, atypical   . LBBB (left bundle branch block)   . Acute bronchitis   . Anxiety     unspecified  . Shoulder pain   . Benign positional vertigo   . Hemorrhoids, external   . Pain, upper back   . SOB (shortness of breath)   . Hypertension   . Depression     major, RCR, mild  . Allergy     rhinitis  . Osteoporosis     generalized, multiple sites  . Hypertriglyceridemia   . Diverticulitis of colon     NOS  . Fatty liver     on CT, G6, P3 A3 (mis)  . History of sarcoidosis   . Lumbar stenosis 6-08    at L[facility], L MRI, saw Dr Blanche East  . Sarcoidosis   . Pulmonary embolism      Current Outpatient Prescriptions  Medication Sig Dispense Refill  . Calcium Carbonate-Vitamin D (CALCIUM + D PO) Take 1 tablet by mouth 2 (two) times daily.        Marland Kitchen escitalopram (LEXAPRO) 10 MG tablet Take 10 mg by mouth daily.        Marland Kitchen guaiFENesin (MUCINEX) 600 MG 12 hr tablet Take 1,200 mg by mouth daily.       Marland Kitchen ibandronate (BONIVA) 150 MG tablet Take 150 mg by mouth every 30 (thirty) days. Take in the morning with a full glass of water, on an empty stomach, and do not take anything else by mouth or lie down for the next  30 min.       . lansoprazole (PREVACID) 30 MG capsule Take 30 mg by mouth daily.        Marland Kitchen lisinopril-hydrochlorothiazide (PRINZIDE,ZESTORETIC) 20-12.5 MG per tablet Take 1 tablet by mouth daily.        . multivitamin (THERAGRAN) tablet Take 1 tablet by mouth daily.        . piroxicam (FELDENE) 10 MG capsule Take 10 mg by mouth daily.       . Probiotic Product (ALIGN PO) Take 1 tablet by mouth daily.        . simvastatin (ZOCOR) 20 MG tablet Take 20 mg by mouth at bedtime.           No Known Allergies   Family History  Problem Relation Age of Onset  . Cancer Mother     Breast  . Cancer Sister     breast  . Cancer Sister     breast  . Cancer Sister     breast  . Cancer Sister     breast  . Cancer Other     breast/breast/breast     History   Social History  . Marital Status: Widowed    Spouse Name: N/A  Number of Children: N/A  . Years of Education: N/A   Social History Main Topics  . Smoking status: Never Smoker   . Smokeless tobacco: None  . Alcohol Use: No  . Drug Use: No  . Sexually Active:    Other Topics Concern  . None   Social History Narrative  . None     REVIEW OF SYSTEMS - PERTINENT POSITIVES ONLY: Intermittent bleeding, burning pain, pain with bowel movement.   EXAM: Filed Vitals:   02/09/11 1314  BP: 136/83  Pulse: 87    HEENT: normocephalic; pupils equal and reactive; sclerae clear; dentition good; mucous membranes moist NECK:  No palpable masses; symmetric on extension; no palpable anterior or posterior cervical lymphadenopathy; no supraclavicular masses; no tenderness CHEST: clear to auscultation bilaterally without rales, rhonchi, or wheezes CARDIAC: regular rate and rhythm without significant murmur; peripheral pulses are full ANORECTAL: External exam shows multiple anal skin tags in the anterior midline and posterior midline. He version of the anoderm however shows a posterior anal fissure which appears acute. There is slight  friability. There is tenderness to palpation. Digital rectal exam is performed and shows normal anal tone. No palpable masses. Moderate edema in the hemorrhoidal tissues. Anoscopy is performed and shows a normal lower rectal mucosa.  There are grade 2 internal hemorrhoids and some prominent dermal papillae. The fissure in the posterior midline is relatively superficial and friable. EXT:  non-tender without edema; no deformity NEURO: no gross focal deficits; no sign of tremor   LABORATORY RESULTS: See E-Chart for most recent results   RADIOLOGY RESULTS: See E-Chart or I-Site for most recent results   IMPRESSION: #1 posterior anal fissure, acute #2 grade 2 internal hemorrhoids with recent bleeding   PLAN: Patient and I discussed management of both the posterior anal fissure and the internal hemorrhoids. At this point she does not require surgery. I am going to start her on topical diltiazem 2% cream 4 times daily. I have also given her a prescription for Anusol HC suppositories to use twice a day for 10 days. Patient will increase water intake and use a stool softener such as MiraLAX. She'll continue to use baby wipes. I've asked her to do tub soaks 2-4 times daily.  Patient will return in 4 weeks for assessment. If she is improving clinically I think she will avoid operative intervention. If she has persistent symptoms she may require fissurectomy and lateral internal sphincterotomy. Written materials are provided to the patient to review.   Velora Heckler, MD, FACS General & Endocrine Surgery [facility]      Visit Diagnoses: 1. Anal fissure   2. Hemorrhoids, internal, with complication     Primary Care Physician: Nani Gasser, MD, MD

## 2011-02-09 NOTE — Patient Instructions (Signed)
ANORECTAL PROCEDURES: 1.  Tub soaks 2-3 times daily in warm water (may add Epsom salts if desired) 2.  Stool softener for one month (store brand Miralax or Colace) 3.  Avoid toilet paper - use baby wipes or Tucks pads 4.  Increase water intake - 6-8 glasses daily 5.  Apply dry pad to area until drainage stops 

## 2011-03-10 ENCOUNTER — Encounter (INDEPENDENT_AMBULATORY_CARE_PROVIDER_SITE_OTHER): Payer: Self-pay | Admitting: Surgery

## 2011-04-19 ENCOUNTER — Other Ambulatory Visit: Payer: Self-pay | Admitting: Family Medicine

## 2011-06-24 ENCOUNTER — Other Ambulatory Visit: Payer: Self-pay | Admitting: Family Medicine

## 2011-07-15 ENCOUNTER — Encounter: Payer: Self-pay | Admitting: Family Medicine

## 2011-07-15 ENCOUNTER — Ambulatory Visit (INDEPENDENT_AMBULATORY_CARE_PROVIDER_SITE_OTHER): Payer: Medicare Other | Admitting: Family Medicine

## 2011-07-15 VITALS — BP 148/71 | HR 105 | Ht 61.5 in | Wt 135.0 lb

## 2011-07-15 DIAGNOSIS — F419 Anxiety disorder, unspecified: Secondary | ICD-10-CM

## 2011-07-15 DIAGNOSIS — H109 Unspecified conjunctivitis: Secondary | ICD-10-CM

## 2011-07-15 DIAGNOSIS — F411 Generalized anxiety disorder: Secondary | ICD-10-CM

## 2011-07-15 MED ORDER — ERYTHROMYCIN 5 MG/GM OP OINT: TOPICAL_OINTMENT | Freq: Four times a day (QID) | OPHTHALMIC | Status: AC

## 2011-07-15 MED ORDER — ESCITALOPRAM OXALATE 20 MG PO TABS: 20.0000 mg | ORAL_TABLET | Freq: Every day | ORAL | Status: DC

## 2011-07-15 MED ORDER — ALPRAZOLAM 0.25 MG PO TABS: 0.2500 mg | ORAL_TABLET | Freq: Two times a day (BID) | ORAL | Status: AC | PRN

## 2011-07-15 NOTE — Progress Notes (Addendum)
  Subjective:    Patient ID: Deanna Wilson, female    DOB: Oct 05, 1935, 76 y.o.   MRN: [id number]  HPI  She's had discomfort in scratchiness in her left eye for about 5 or 6 days. She said when it first started it woke her up in the middle of the night. She wonders if she may rub or scratch something. It is much better but is still a little bit uncomfortable. She is also now noticing a slight discharge and crust in the corner of the that she cleans out in the morning. She denies any actual vision changes. She does have a history of cataract surgery. She has not had any fevers, sore throat or other symptoms. She did try some Visine initially but feels it is not helping anymore.  Review of Systems     Objective:   Physical Exam  Constitutional: She appears well-developed and well-nourished.  HENT:  Head: Normocephalic and atraumatic.  Eyes: Conjunctivae and EOM are normal. Pupils are equal, round, and reactive to light.       I did apply fluorescein stain after sitting in a tetracaine drop. I did not see any abrasion to the cornea using a black light. Normal-appearing blood vessels on funduscopic exam.          Assessment & Plan:  Conjunctivitis-I will go ahead and put her on erythromycin ophthalmic ointment. That I see no corneal abrasion or ulcer that is of concern, but because her symptoms have lasted 5 days and she's had a sensation of irritation I will prescribe antibiotic. She's not completely better in 5-7 days then please call the office back.   She also went to give me an update. Her 20 year old grandson who is otherwise healthy was recently diagnosed with hepatic aplastic anemia. This is extraordinarily rare. He was hospitalized for about a month with a platelet count of 2000 and then was discharged home for 2 days. Unfortunately he suffered a bleed on his brain and has now been in the intensive care unit for over a week and is no longer responsive. She R. he takes Lexapro and  wonders if we can adjust her dose if she could have something to take as needed for her mood. She says she's been sitting at the hospital all day for the last week. Clearly she is very shaken by all of this. I will increase her Lexapro to 20 mg and I will send her a prescription for Xanax to for as needed use. She says she has taken it before and did well with it but it did make her a little sleepy.

## 2011-07-20 ENCOUNTER — Other Ambulatory Visit: Payer: Self-pay | Admitting: Family Medicine

## 2011-07-31 ENCOUNTER — Other Ambulatory Visit: Payer: Self-pay | Admitting: Family Medicine

## 2011-08-03 ENCOUNTER — Other Ambulatory Visit: Payer: Self-pay | Admitting: *Deleted

## 2011-08-03 MED ORDER — LANSOPRAZOLE 30 MG PO CPDR: 30.0000 mg | DELAYED_RELEASE_CAPSULE | Freq: Every day | ORAL | Status: DC

## 2011-08-18 ENCOUNTER — Other Ambulatory Visit: Payer: Self-pay | Admitting: Family Medicine

## 2011-09-22 ENCOUNTER — Other Ambulatory Visit: Payer: Self-pay | Admitting: Family Medicine

## 2011-10-08 ENCOUNTER — Other Ambulatory Visit: Payer: Self-pay | Admitting: Family Medicine

## 2011-10-11 ENCOUNTER — Other Ambulatory Visit: Payer: Self-pay | Admitting: *Deleted

## 2011-10-25 ENCOUNTER — Other Ambulatory Visit: Payer: Self-pay | Admitting: Family Medicine

## 2011-10-30 ENCOUNTER — Other Ambulatory Visit: Payer: Self-pay | Admitting: Family Medicine

## 2011-11-01 ENCOUNTER — Other Ambulatory Visit: Payer: Self-pay | Admitting: Physician Assistant

## 2011-11-01 MED ORDER — MELOXICAM 7.5 MG PO TABS: 7.5000 mg | ORAL_TABLET | Freq: Every day | ORAL | Status: DC

## 2011-11-01 NOTE — Progress Notes (Signed)
Per pharmacy will change Piroxicam to mobic.

## 2011-12-02 ENCOUNTER — Other Ambulatory Visit: Payer: Self-pay | Admitting: Family Medicine

## 2011-12-08 ENCOUNTER — Other Ambulatory Visit: Payer: Self-pay | Admitting: Family Medicine

## 2012-01-01 ENCOUNTER — Other Ambulatory Visit: Payer: Self-pay | Admitting: Family Medicine

## 2012-02-07 ENCOUNTER — Other Ambulatory Visit: Payer: Self-pay | Admitting: Family Medicine

## 2012-02-22 ENCOUNTER — Ambulatory Visit: Payer: Medicare Other | Admitting: Family Medicine

## 2012-02-23 ENCOUNTER — Ambulatory Visit (INDEPENDENT_AMBULATORY_CARE_PROVIDER_SITE_OTHER): Payer: Medicare Other | Admitting: Family Medicine

## 2012-02-23 ENCOUNTER — Encounter: Payer: Self-pay | Admitting: Family Medicine

## 2012-02-23 VITALS — BP 120/61 | HR 100 | Ht 60.0 in | Wt 137.0 lb

## 2012-02-23 DIAGNOSIS — I10 Essential (primary) hypertension: Secondary | ICD-10-CM

## 2012-02-23 DIAGNOSIS — M25519 Pain in unspecified shoulder: Secondary | ICD-10-CM

## 2012-02-23 DIAGNOSIS — R0982 Postnasal drip: Secondary | ICD-10-CM

## 2012-02-23 MED ORDER — LISINOPRIL 20 MG PO TABS: 20.0000 mg | ORAL_TABLET | Freq: Every day | ORAL | Status: DC

## 2012-02-23 MED ORDER — THERA VITAL M PO TABS: 1.0000 | ORAL_TABLET | Freq: Every day | ORAL | Status: DC

## 2012-02-23 MED ORDER — GUAIFENESIN ER 600 MG PO TB12: 600.0000 mg | ORAL_TABLET | Freq: Two times a day (BID) | ORAL | Status: DC

## 2012-02-23 MED ORDER — CALCIUM CARBONATE-VITAMIN D 600-400 MG-UNIT PO TABS: 1.0000 | ORAL_TABLET | Freq: Two times a day (BID) | ORAL | Status: DC

## 2012-02-23 MED ORDER — AMBULATORY NON FORMULARY MEDICATION: Status: DC

## 2012-02-23 NOTE — Patient Instructions (Addendum)
Call when back in town for your shoulder if not really any better.   Rotator Cuff Tendonitis   The rotator cuff is the collection of all the muscles and tendons (the supraspinatus, infraspinatus, subscapularis, and teres minor muscles and their tendons) that help your shoulder stay in place. This unit holds the head of the upper arm bone (humerus) in the cup (fossa) of the shoulder blade (scapula). Basically, it connects the arm to the shoulder. Tendinitis is a swelling and irritation of the tissue, called cord like structures (tendons) that connect muscle to bone. It usually is caused by overusing the joint involved. When the tissue surrounding a tendon (the synovium) becomes inflamed, it is called tenosynovitis. This also is often the result of overuse in people whose jobs require repetitive (over and over again) types of motion. HOME CARE INSTRUCTIONS    Use a sling or splint for as long as directed by your caregiver until the pain decreases.   Apply ice to the injury for 15 to 20 minutes, 3 to 4 times per day. Put the ice in a plastic bag and place a towel between the bag of ice and your skin.   Try to avoid use other than gentle range of motion while your shoulder is painful. Use and exercise only as directed by your caregiver. Stop exercises or range of motion if pain or discomfort increases, unless directed otherwise by your caregiver.   Only take over-the-counter or prescription medicines for pain, discomfort, or fever as directed by your caregiver.   If you were give a shoulder sling and straps (immobilizer), do not remove it except as directed, or until you see a caregiver for a follow-up examination. If you need to remove it, move your arm as little as possible or as directed.   You may want to sleep on several pillows at night to lessen swelling and pain.  SEEK IMMEDIATE MEDICAL CARE IF:    Pain in your shoulder increases or new pain develops in your arm, hand, or fingers and is not  relieved with medications.   You develop new, unexplained symptoms, especially increased numbness in the hands or loss of strength, or you develop any worsening of the problems which brought you in for care.   Your arm, hand, or fingers are numb or tingling.   Your arm, hand, or fingers are swollen, painful, or turn white or blue.  Document Released: 07/17/2003 Document Revised: 07/19/2011 Document Reviewed: 02/22/2008 [facility] Patient Information 2013 [place], [place].

## 2012-02-23 NOTE — Progress Notes (Signed)
Subjective:    Patient ID: Deanna Wilson, female    DOB: August 06, 1935, 76 y.o.   MRN: [id number]  HPI Lefts hsouler pain for a few months. She points to the top of her shoulder. Says can't sleep on that side, because too painful.  Pain radiates down into her left upper arm. Using her mobic once a day as needed and does help some. No known trauma. She does have a history degenerative disc disease.  HTN - She wasn't to stop her diuretic since has a "leaky " bladder. No CP or SOB.    Has a lot of nasal drianage.  Getting some bloody clots. No ST, no facial pressure. No fever, cough or sore throat. Does have a history of allergic rhinitis. She has not tried any allergy medicines. Review of Systems     Objective:   Physical Exam  Constitutional: She is oriented to person, place, and time. She appears well-developed and well-nourished.  HENT:  Head: Normocephalic and atraumatic.  Right Ear: External ear normal.  Left Ear: External ear normal.  Nose: Nose normal.  Mouth/Throat: Oropharynx is clear and moist.       TMs and canals are clear.   Eyes: Conjunctivae normal and EOM are normal. Pupils are equal, round, and reactive to light.  Neck: Neck supple. No thyromegaly present.  Cardiovascular: Normal rate, regular rhythm and normal heart sounds.   Pulmonary/Chest: Effort normal and breath sounds normal. She has no wheezes.  Musculoskeletal:       Left shoulder with normal range of motion. Chest positive empty can test on the left. She's also tender over the posterior edge of the acromion and over the upper deltoid. Some mild tenderness over the a.c. joint. Shoulder/elbow strength is 5 out of 5.  Lymphadenopathy:    She has no cervical adenopathy.  Neurological: She is alert and oriented to person, place, and time.  Skin: Skin is warm and dry.  Psychiatric: She has a normal mood and affect.          Assessment & Plan:  Shoulder pain - I. think she has weakness of the rotator cuff  in addition to some subacromial bursitis. We'll try an injection today for temporary pain relief. She will be back in town in about a month and if at that point time she is still having significant pain in the shoulder would like her to see my partner Dr. Benjamin Stain for further evaluation of her rotator cuff. She also has known cervical disc disease which may be contributing to some of her discomfort as well. Please see MRI from 2012.   HTN- well controlled. We'll stop her HCTZ and continue the lisinopril 20 mg. Recheck blood pressure in 2-3 months.  Post nasal drip - gave reassurance. I think this is postnasal drip. No sign of actual infection. Call if suddenly getting worse and I can better. Thirdly she can use a product like Mucinex or just nasal saline if she would like. Make sure drinking plan fluids and staying well hydrated.  Shoulder Injection Procedure Note  Pre-operative Diagnosis: left shoulder, posterolateral approach.   Post-operative Diagnosis: same  Indications: Pain  Procedure Details   Verbal consent was obtained for the procedure. The shoulder was prepped with iodine and the skin was anesthetized. Using a 22 gauge needle the subacromial space is injected with 9 mL 1% lidocaine and 1 mL of triamcinolone (KENALOG) 40mg /ml under the posterior aspect of the acromion. The injection site was cleansed with topical isopropyl  alcohol and a dressing was applied.  Complications:  None; patient tolerated the procedure well.

## 2012-03-06 ENCOUNTER — Ambulatory Visit (INDEPENDENT_AMBULATORY_CARE_PROVIDER_SITE_OTHER): Payer: Medicare Other

## 2012-03-06 ENCOUNTER — Encounter: Payer: Self-pay | Admitting: Physician Assistant

## 2012-03-06 ENCOUNTER — Ambulatory Visit (INDEPENDENT_AMBULATORY_CARE_PROVIDER_SITE_OTHER): Payer: Medicare Other | Admitting: Physician Assistant

## 2012-03-06 VITALS — BP 115/52 | HR 89 | Temp 98.3°F | Ht 60.0 in | Wt 134.0 lb

## 2012-03-06 DIAGNOSIS — R197 Diarrhea, unspecified: Secondary | ICD-10-CM

## 2012-03-06 DIAGNOSIS — R110 Nausea: Secondary | ICD-10-CM

## 2012-03-06 DIAGNOSIS — R1032 Left lower quadrant pain: Secondary | ICD-10-CM

## 2012-03-06 DIAGNOSIS — R109 Unspecified abdominal pain: Secondary | ICD-10-CM

## 2012-03-06 LAB — CBC WITH DIFFERENTIAL/PLATELET
Basophils Absolute: 0 10*3/uL (ref 0.0–0.1)
Basophils Relative: 1 % (ref 0–1)
Eosinophils Absolute: 0.1 10*3/uL (ref 0.0–0.7)
Eosinophils Relative: 1 % (ref 0–5)
Lymphs Abs: 1.5 10*3/uL (ref 0.7–4.0)
MCH: 29.1 pg (ref 26.0–34.0)
MCHC: 33.6 g/dL (ref 30.0–36.0)
MCV: 86.6 fL (ref 78.0–100.0)
Neutrophils Relative %: 72 % (ref 43–77)
Platelets: 227 10*3/uL (ref 150–400)
RBC: 4.26 MIL/uL (ref 3.87–5.11)
RDW: 14.3 % (ref 11.5–15.5)

## 2012-03-06 LAB — POCT URINALYSIS DIPSTICK
Blood, UA: NEGATIVE
Leukocytes, UA: NEGATIVE
Nitrite, UA: NEGATIVE
Protein, UA: NEGATIVE
Urobilinogen, UA: 0.2
pH, UA: 5.5

## 2012-03-06 MED ORDER — AMOXICILLIN-POT CLAVULANATE 500-125 MG PO TABS: 1.0000 | ORAL_TABLET | Freq: Three times a day (TID) | ORAL | Status: DC

## 2012-03-06 MED ORDER — METRONIDAZOLE 500 MG PO TABS: 500.0000 mg | ORAL_TABLET | Freq: Three times a day (TID) | ORAL | Status: DC

## 2012-03-06 NOTE — Progress Notes (Signed)
  Subjective:    Patient ID: Deanna Wilson, female    DOB: 1935/07/17, 76 y.o.   MRN: [id number]  HPI Patient is a 76 yo female who presents to the clinic with pain in her lower abdomen. She has been having pain since Saturday about 3 days ago. Last night she started to feel nauseated and weak. She has a history of bowel obstruction and diverticulitis. She has had most of her colon resected. Her abdominal pain continues to get worse and now she has a dull headache. She reports that it feels just like when her bowel was resected. She reports loose and harden stools. Yesterday she went to the bathroom 4-5 times with loose stool but denies any sign of mucus or blood. Denies any dysuria or abdominal pressure or back pain.   Review of Systems     Objective:   Physical Exam  Constitutional: She is oriented to person, place, and time. She appears well-developed and well-nourished.  HENT:  Head: Normocephalic.  Cardiovascular: Normal rate, regular rhythm and normal heart sounds.   Pulmonary/Chest: Effort normal and breath sounds normal. She has no wheezes.       No cva tenderness.  Abdominal: Soft. Bowel sounds are normal. She exhibits no mass.       Tenderness over the left lower quadrant with palpation along with guarding. Discomfort over the right lower quadrant.   Neurological: She is alert and oriented to person, place, and time.  Skin: Skin is dry.  Psychiatric: She has a normal mood and affect. Her behavior is normal.          Assessment & Plan:  Left quadrant abdominal pain/Nausea-UA was neg for blood, leuks, nitrates. I sent for CBC to look at WBC. WBC was normal. Abdominal x-ray was showed gas but no acute findings. I called patient and she feels like she was continuing to get worse. I suspect gastroenteritis and time with BRAT diet increasing fiber would help. Due to patients history I did treat for divertculitits with Augmentin and Metronidazole. Patient is aware that if she  continues to have symptoms I would like to send for CT of abdomen. Encouraged patient to stay hydrated. Patient did not want anti-nausea at this time if she feels like she needs will call something in.

## 2012-03-06 NOTE — Patient Instructions (Addendum)
Get abdominal x-ray and CBC will call with results and treatment plan.     B.R.A.T. Diet Your doctor has recommended the B.R.A.T. diet for you or your child until the condition improves. This is often used to help control diarrhea and vomiting symptoms. If you or your child can tolerate clear liquids, you may have:  Bananas.   Rice.   Applesauce.   Toast (and other simple starches such as crackers, potatoes, noodles).  Be sure to avoid dairy products, meats, and fatty foods until symptoms are better. Fruit juices such as apple, grape, and prune juice can make diarrhea worse. Avoid these. Continue this diet for 2 days or as instructed by your caregiver. Document Released: 04/26/2005 Document Revised: 04/15/2011 Document Reviewed: 10/13/2006 [facility] Patient Information 2012 [place], [place].

## 2012-03-08 ENCOUNTER — Other Ambulatory Visit: Payer: Self-pay | Admitting: Family Medicine

## 2012-03-11 ENCOUNTER — Other Ambulatory Visit: Payer: Self-pay | Admitting: Family Medicine

## 2012-03-13 ENCOUNTER — Ambulatory Visit (INDEPENDENT_AMBULATORY_CARE_PROVIDER_SITE_OTHER): Payer: Medicare Other | Admitting: Family Medicine

## 2012-03-13 ENCOUNTER — Telehealth: Payer: Self-pay

## 2012-03-13 ENCOUNTER — Encounter: Payer: Self-pay | Admitting: Family Medicine

## 2012-03-13 ENCOUNTER — Ambulatory Visit (HOSPITAL_BASED_OUTPATIENT_CLINIC_OR_DEPARTMENT_OTHER)
Admission: RE | Admit: 2012-03-13 | Discharge: 2012-03-13 | Disposition: A | Discharge status: Home / Self Care | Payer: Medicare Other | Source: Ambulatory Visit | Attending: Family Medicine | Admitting: Family Medicine

## 2012-03-13 ENCOUNTER — Other Ambulatory Visit: Payer: Self-pay | Admitting: Family Medicine

## 2012-03-13 VITALS — BP 120/61 | HR 65 | Ht 62.0 in | Wt 137.0 lb

## 2012-03-13 DIAGNOSIS — K5730 Diverticulosis of large intestine without perforation or abscess without bleeding: Secondary | ICD-10-CM | POA: Insufficient documentation

## 2012-03-13 DIAGNOSIS — R599 Enlarged lymph nodes, unspecified: Secondary | ICD-10-CM | POA: Insufficient documentation

## 2012-03-13 DIAGNOSIS — R1903 Right lower quadrant abdominal swelling, mass and lump: Secondary | ICD-10-CM

## 2012-03-13 DIAGNOSIS — R1909 Other intra-abdominal and pelvic swelling, mass and lump: Secondary | ICD-10-CM

## 2012-03-13 DIAGNOSIS — R590 Localized enlarged lymph nodes: Secondary | ICD-10-CM

## 2012-03-13 NOTE — Patient Instructions (Addendum)
Will schedule a CT of pelvis. We will call you with the information

## 2012-03-13 NOTE — Telephone Encounter (Signed)
Ok withour IV contrast.

## 2012-03-13 NOTE — Addendum Note (Signed)
Addended by: Nani Gasser D on: 03/13/2012 10:48 AM   Modules accepted: Orders

## 2012-03-13 NOTE — Telephone Encounter (Signed)
The labs have not resulted for the BMP. Can they go ahead and do the scan without contrast? She has already drank the oral contrast and does not want to miss the window and have to drink the oral contrast again.

## 2012-03-13 NOTE — Progress Notes (Addendum)
Subjective:    Patient ID: Deanna Wilson, female    DOB: 1935-08-10, 76 y.o.   MRN: [id number]  HPI Lump in groin x 1 week.  Never had it before.  Says only mildly painful.  Had a recent flare of diverticulitis.  No trauma.  No fever.  Did have abd pain last week.  Had CBC drawn last week.  Note she iced it some of her antibiotics for diverticulitis. She says she missed several doses because of GI intolerance. But she actually feels better today overall.   Review of Systems BP 120/61  Pulse 65  Ht 5\' 2"  (1.575 m)  Wt 137 lb (62.143 kg)  BMI 25.06 kg/m2    No Known Allergies  Past Medical History  Diagnosis Date  . Chest pain, atypical   . LBBB (left bundle branch block)   . Anxiety     unspecified  . Shoulder pain   . Benign positional vertigo   . Hemorrhoids, external   . Hypertension   . Depression     major, RCR, mild  . Allergy     rhinitis  . Osteoporosis     generalized, multiple sites  . Hypertriglyceridemia   . Diverticulitis of colon     NOS  . Fatty liver     on CT, G6, P3 A3 (mis)  . History of sarcoidosis   . Lumbar stenosis 6-08    at L4, L MRI, saw Dr Blanche East  . Sarcoidosis   . Pulmonary embolism     Past Surgical History  Procedure Date  . Mastectomy 1975    bilat- prophylactic  . Bilateral oophorectomy 1982  . Carpel tunnel 1990  . Cholecystectomy 1991  . Abdominal hysterectomy 1980    fibroids  . Pft's     no obstruction  . Breast surgery 1975    saline implants  . Partial colectomy 1989    for diverticulitis    History   Social History  . Marital Status: Widowed    Spouse Name: N/A    Number of Children: N/A  . Years of Education: N/A   Occupational History  . Not on file.   Social History Main Topics  . Smoking status: Never Smoker   . Smokeless tobacco: Not on file  . Alcohol Use: No  . Drug Use: No  . Sexually Active:    Other Topics Concern  . Not on file   Social History Narrative  . No narrative on file     Family History  Problem Relation Age of Onset  . Cancer Mother     Breast  . Cancer Sister     breast  . Cancer Sister     breast  . Cancer Sister     breast  . Cancer Sister     breast  . Cancer Other     breast/breast/breast  . Aplastic anemia Grandchild 10    Outpatient Encounter Prescriptions as of 03/13/2012  Medication Sig Dispense Refill  . AMBULATORY NON FORMULARY MEDICATION Medication Name: shingles vaccine IM x 1  1 vial  0  . Calcium Carbonate-Vitamin D (CALCIUM + D PO) Take 1 tablet by mouth 2 (two) times daily.        . Calcium Carbonate-Vitamin D (CALTRATE 600+D) 600-400 MG-UNIT per tablet Take 1 tablet by mouth 2 (two) times daily.  60 tablet  11  . guaiFENesin (MUCINEX) 600 MG 12 hr tablet Take 1 tablet (600 mg total) by mouth 2 (two) times  daily.  60 tablet  2  . ibandronate (BONIVA) 150 MG tablet Take 150 mg by mouth every 30 (thirty) days. Take in the morning with a full glass of water, on an empty stomach, and do not take anything else by mouth or lie down for the next 30 min.       . lansoprazole (PREVACID) 30 MG capsule TAKE (1) CAPSULE DAILY.  30 capsule  3  . LEXAPRO 20 MG tablet Take 1 tablet (20 mg total) by mouth daily.  30 each  1  . lisinopril (PRINIVIL,ZESTRIL) 20 MG tablet Take 1 tablet (20 mg total) by mouth daily.  90 tablet  2  . meloxicam (MOBIC) 7.5 MG tablet Take 1 tablet (7.5 mg total) by mouth daily.  30 tablet  2  . metroNIDAZOLE (FLAGYL) 500 MG tablet Take 1 tablet (500 mg total) by mouth 3 (three) times daily. For 7 days.  21 tablet  0  . Multiple Vitamins-Minerals (MULTIVITAMIN) tablet Take 1 tablet by mouth daily.  90 tablet  3  . multivitamin (THERAGRAN) tablet Take 1 tablet by mouth daily.        . piroxicam (FELDENE) 10 MG capsule TAKE  (1)  CAPSULE  TWICE DAILY.  60 capsule  1  . Probiotic Product (ALIGN PO) Take 1 tablet by mouth daily.        Marland Kitchen amoxicillin-clavulanate (AUGMENTIN) 500-125 MG per tablet Take 1 tablet (500 mg total)  by mouth 3 (three) times daily. For 7 days.  21 tablet  0          Objective:   Physical Exam  Constitutional: She appears well-developed and well-nourished.  HENT:  Head: Normocephalic and atraumatic.  Abdominal: Soft. Bowel sounds are normal. She exhibits no distension and no mass. There is tenderness. There is no rebound and no guarding.       Tender in the epigastrium. Surgical scar is well-healed.  Lymphadenopathy:    She has no cervical adenopathy.    She has no axillary adenopathy.       I did palpate what feels like lymph tissue that's approximately 1.5 x 5 cm in size, along the right groin crease. It is sausagelike. It is not reducible like a hernia. It feels more like lymph tissue. It is not rock hard. I feel a small her approximately 1 cm lymph node along the left groin crease.  Skin:       No palpable lymph nodes around the neck. No supraclavicular nodes. No palpable lymph nodes in the axilla.       Assessment & Plan:  Right groin mass-unclear etiology. It feels similar to a lymph node that the size would be unusual. Be very strange to be 5 cm oblong over such a short period of time. She is very tender to touch. It is not reducible like a herniation. She did have abdominal pain last week but had a normal CBC with differential. An appointment repeat that today. We will schedule her for CT of the pelvis for further evaluation. No other significant palpable lymphadenopathy. She has no infections of the lower Schimke's. . Lab Results  Component Value Date   WBC 8.2 03/06/2012   HGB 12.4 03/06/2012   HCT 36.9 03/06/2012   MCV 86.6 03/06/2012   PLT 227 03/06/2012

## 2012-03-14 ENCOUNTER — Other Ambulatory Visit: Payer: Self-pay | Admitting: Family Medicine

## 2012-03-14 DIAGNOSIS — R599 Enlarged lymph nodes, unspecified: Secondary | ICD-10-CM

## 2012-03-14 LAB — BASIC METABOLIC PANEL WITH GFR
CO2: 27 mEq/L (ref 19–32)
Calcium: 9.3 mg/dL (ref 8.4–10.5)
Creat: 1.08 mg/dL (ref 0.50–1.10)
GFR, Est African American: 58 mL/min — ABNORMAL LOW
Glucose, Bld: 103 mg/dL — ABNORMAL HIGH (ref 70–99)
Sodium: 139 mEq/L (ref 135–145)

## 2012-03-15 LAB — CBC WITH DIFFERENTIAL/PLATELET
Basophils Absolute: 0 10*3/uL (ref 0.0–0.1)
Basophils Relative: 0 % (ref 0–1)
Eosinophils Absolute: 0.1 10*3/uL (ref 0.0–0.7)
Eosinophils Relative: 2 % (ref 0–5)
Lymphs Abs: 2.3 10*3/uL (ref 0.7–4.0)
MCH: 28.7 pg (ref 26.0–34.0)
MCHC: 32.5 g/dL (ref 30.0–36.0)
MCV: 88.2 fL (ref 78.0–100.0)
Neutrophils Relative %: 67 % (ref 43–77)
Platelets: 232 10*3/uL (ref 150–400)
RBC: 4.25 MIL/uL (ref 3.87–5.11)
RDW: 14.1 % (ref 11.5–15.5)

## 2012-03-15 LAB — PATHOLOGIST SMEAR REVIEW

## 2012-03-15 NOTE — Progress Notes (Signed)
Quick Note:  All labs are normal. ______ 

## 2012-03-16 ENCOUNTER — Encounter (INDEPENDENT_AMBULATORY_CARE_PROVIDER_SITE_OTHER): Payer: Self-pay | Admitting: General Surgery

## 2012-03-16 ENCOUNTER — Telehealth: Payer: Self-pay | Admitting: *Deleted

## 2012-03-16 ENCOUNTER — Ambulatory Visit (INDEPENDENT_AMBULATORY_CARE_PROVIDER_SITE_OTHER): Payer: Medicare Other | Admitting: General Surgery

## 2012-03-16 VITALS — BP 140/88 | HR 77 | Temp 97.6°F | Resp 14 | Ht 60.0 in | Wt 134.8 lb

## 2012-03-16 DIAGNOSIS — R590 Localized enlarged lymph nodes: Secondary | ICD-10-CM

## 2012-03-16 DIAGNOSIS — R599 Enlarged lymph nodes, unspecified: Secondary | ICD-10-CM

## 2012-03-16 MED ORDER — METRONIDAZOLE 500 MG PO TABS: 500.0000 mg | ORAL_TABLET | Freq: Three times a day (TID) | ORAL | Status: DC

## 2012-03-16 MED ORDER — CIPROFLOXACIN HCL 500 MG PO TABS: 500.0000 mg | ORAL_TABLET | Freq: Two times a day (BID) | ORAL | Status: DC

## 2012-03-16 NOTE — Telephone Encounter (Signed)
Sent the rx for cipro. Can she take the metronidazole?

## 2012-03-16 NOTE — Telephone Encounter (Signed)
Pt not sure because she took the 2 meds together but still has them in the home.

## 2012-03-16 NOTE — Telephone Encounter (Signed)
Also sent rx for metronidazole. See if can take both

## 2012-03-16 NOTE — Progress Notes (Signed)
Subjective:     Patient ID: Deanna Wilson, female   DOB: [birth year], 76 y.o.   MRN: [id number]  HPI The patient is a 76 year old female who is referred by Dr. Linford Arnold for right lymphadenopathy. Patient most recently has been treated for with antibiotics for a bout of diverticulitis. She states she had stopped the antibiotics secondary to GI dysfunction.This states she's had no trauma or infection to her bilateral lower extremities.   Review of Systems  Constitutional: Negative.  Negative for fever, fatigue and unexpected weight change.  HENT: Negative.   Eyes: Negative.   Respiratory: Negative.   Cardiovascular: Negative.   Gastrointestinal: Positive for abdominal pain.  Neurological: Negative.        Objective:   Physical Exam  Constitutional: She is oriented to person, place, and time. She appears well-developed.  HENT:  Head: Normocephalic and atraumatic.  Eyes: Conjunctivae normal are normal. Pupils are equal, round, and reactive to light.  Neck: Normal range of motion. Neck supple.  Cardiovascular: Normal rate and regular rhythm.   Pulmonary/Chest: Effort normal and breath sounds normal.  Abdominal: Soft. Bowel sounds are normal.         Palpable lymphadenopathy  Musculoskeletal: Normal range of motion.  Neurological: She is alert and oriented to person, place, and time.       Assessment:     The patient is 76 year old female withright inguinal lymphadenopathy. Patient undergoing treatment for a bout of diverticulitis. At this time lymphadenopathy could be related to her diverticulitis her most more concern would be a potential for lymphoma.    Plan:     1. At this time will observe right lymphadenopathy and the patient back in approximately one month to reevaluate. This should allow for for diverticulitis to resolve which could be leading to her lymphadenopathy. Should the patient's lymphadenopathy continue or get bigger and will discuss excision of her lymph node.   At this time the patient and her daughter they agree at this time.

## 2012-03-16 NOTE — Telephone Encounter (Signed)
Pt calls and states seen the surgeon this morning and he told her since she had a recent bout with diverticulitis that he feels that it is probably due to infection and needs more antibiotics. Pt states could not take the Amoxicillan and other med you put her on for it cause it made her really nauseated and sick. She states she can take Zpak and Cipro without any problems. Surgeon wants to see her back in 1 month but for now felt needed antib. Pt said could come in for visit if you wanted her to or just send med in either way was fine

## 2012-03-17 NOTE — Telephone Encounter (Signed)
Pt.notified

## 2012-03-24 ENCOUNTER — Encounter: Payer: Self-pay | Admitting: Emergency Medicine

## 2012-03-24 ENCOUNTER — Emergency Department (INDEPENDENT_AMBULATORY_CARE_PROVIDER_SITE_OTHER): Payer: Medicare Other

## 2012-03-24 ENCOUNTER — Emergency Department
Admission: EM | Admit: 2012-03-24 | Discharge: 2012-03-24 | Disposition: A | Discharge status: Home / Self Care | Payer: Medicare Other | Source: Home / Self Care

## 2012-03-24 DIAGNOSIS — R071 Chest pain on breathing: Secondary | ICD-10-CM

## 2012-03-24 DIAGNOSIS — R079 Chest pain, unspecified: Secondary | ICD-10-CM

## 2012-03-24 DIAGNOSIS — R0789 Other chest pain: Secondary | ICD-10-CM

## 2012-03-24 DIAGNOSIS — R0781 Pleurodynia: Secondary | ICD-10-CM

## 2012-03-24 HISTORY — DX: Malignant (primary) neoplasm, unspecified: C80.1

## 2012-03-24 LAB — D-DIMER, QUANTITATIVE: D-Dimer, Quant: 1.39 ug/mL-FEU — ABNORMAL HIGH (ref 0.00–0.48)

## 2012-03-24 NOTE — ED Notes (Signed)
Woke up yesterday morning with pain in rib area; left lateral/low; hurts with inspiration; no recent URI/cough. Currently on ABX for diverticulitis. Has not had Flu Vaccination this season due to recent GI problems and medications. Not OTCs today.

## 2012-03-24 NOTE — ED Provider Notes (Signed)
History     CSN: [id number]  Arrival date & time 03/24/12  1703   First MD Initiated Contact with Patient 03/24/12 1708      Chief Complaint  Patient presents with  . Chest Pain   HPI Pt presents today with L sided lower rib pain x 1 day.  Pt states that she woke up this morning and noticed rib pain that persisted throughout the day.  Pt denies any recent traumas or infections.  Pt also reports pain with deep breathing over same area, though pt denies any central CP, nausea, or diaphoresis.  Pt denies any rash or blistering. Pt recently received her shingles immunization per pt.   No SOB or cough. Pt denies any increased WOB.  Pt reports that she has been getting treatment for extended course of abx. Diverticular pain was mainly in lower abdomen. This has since improved. Bowel movements have also improved.  Pt with prior hx/o PE. Sxs do not feel like prior PE.  Pt states that she has felt palpable area of pain in L lower rib cage.   Past Medical History  Diagnosis Date  . Chest pain, atypical   . LBBB (left bundle branch block)   . Anxiety     unspecified  . Shoulder pain   . Benign positional vertigo   . Hemorrhoids, external   . Hypertension   . Depression     major, RCR, mild  . Allergy     rhinitis  . Osteoporosis     generalized, multiple sites  . Hypertriglyceridemia   . Diverticulitis of colon     NOS  . Fatty liver     on CT, G6, P3 A3 (mis)  . History of sarcoidosis   . Lumbar stenosis 6-08    at L4, L MRI, saw Dr Blanche East  . Sarcoidosis   . Pulmonary embolism   . Cancer     Past Surgical History  Procedure Date  . Mastectomy 1975    bilat- prophylactic  . Bilateral oophorectomy 1982  . Carpel tunnel 1990  . Cholecystectomy 1991  . Abdominal hysterectomy 1980    fibroids  . Pft's     no obstruction  . Breast surgery 1975    saline implants  . Partial colectomy 1989    for diverticulitis    Family History  Problem Relation Age of Onset  .  Cancer Mother     Breast  . Cancer Sister     breast  . Cancer Sister     breast  . Cancer Sister     breast  . Cancer Sister     breast  . Cancer Other     breast/breast/breast  . Aplastic anemia Grandchild 10    History  Substance Use Topics  . Smoking status: Never Smoker   . Smokeless tobacco: Not on file  . Alcohol Use: No    OB History    Grav Para Term Preterm Abortions TAB SAB Ect Mult Living                  Review of Systems  All other systems reviewed and are negative.    Allergies  Review of patient's allergies indicates no known allergies.  Home Medications   Current Outpatient Rx  Name  Route  Sig  Dispense  Refill  . AMBULATORY NON FORMULARY MEDICATION      Medication Name: shingles vaccine IM x 1   1 vial   0   .  AMOXICILLIN-POT CLAVULANATE 500-125 MG PO TABS   Oral   Take 1 tablet (500 mg total) by mouth 3 (three) times daily. For 7 days.   21 tablet   0   . CALCIUM + D PO   Oral   Take 1 tablet by mouth 2 (two) times daily.           Marland Kitchen CALCIUM CARBONATE-VITAMIN D 600-400 MG-UNIT PO TABS   Oral   Take 1 tablet by mouth 2 (two) times daily.   60 tablet   11   . CIPROFLOXACIN HCL 500 MG PO TABS   Oral   Take 1 tablet (500 mg total) by mouth 2 (two) times daily.   20 tablet   0   . GUAIFENESIN ER 600 MG PO TB12   Oral   Take 1 tablet (600 mg total) by mouth 2 (two) times daily.   60 tablet   2   . IBANDRONATE SODIUM 150 MG PO TABS   Oral   Take 150 mg by mouth every 30 (thirty) days. Take in the morning with a full glass of water, on an empty stomach, and do not take anything else by mouth or lie down for the next 30 min.          Marland Kitchen LANSOPRAZOLE 30 MG PO CPDR      TAKE (1) CAPSULE DAILY.   30 capsule   3   . LEXAPRO 20 MG PO TABS      Take 1 tablet (20 mg total) by mouth daily.   30 each   1   . LISINOPRIL 20 MG PO TABS   Oral   Take 1 tablet (20 mg total) by mouth daily.   90 tablet   2   . MELOXICAM  7.5 MG PO TABS   Oral   Take 1 tablet (7.5 mg total) by mouth daily.   30 tablet   2   . METRONIDAZOLE 500 MG PO TABS   Oral   Take 1 tablet (500 mg total) by mouth 3 (three) times daily. For 7 days.   21 tablet   0   . THERA VITAL M PO TABS   Oral   Take 1 tablet by mouth daily.   90 tablet   3   . THERAPEUTIC MULTIVITAMIN PO TABS   Oral   Take 1 tablet by mouth daily.           Marland Kitchen PIROXICAM 10 MG PO CAPS      TAKE  (1)  CAPSULE  TWICE DAILY.   60 capsule   1   . ALIGN PO   Oral   Take 1 tablet by mouth daily.             BP 161/80  Pulse 86  Temp 98 F (36.7 C) (Oral)  Resp 18  Ht 5' (1.524 m)  Wt 135 lb (61.236 kg)  BMI 26.37 kg/m2  SpO2 97%  Physical Exam  Constitutional: She appears well-developed and well-nourished.  HENT:  Head: Normocephalic and atraumatic.  Mouth/Throat: Oropharynx is clear and moist.  Eyes: Conjunctivae normal are normal. Pupils are equal, round, and reactive to light.  Neck: Normal range of motion. Neck supple.  Cardiovascular: Normal rate and regular rhythm.   Pulmonary/Chest: Effort normal and breath sounds normal.    Abdominal: Soft.  Lymphadenopathy:    She has no cervical adenopathy.    ED Course  Procedures (including critical care time)  Labs Reviewed - No  data to display Dg Ribs Unilateral W/chest Left  03/24/2012  *RADIOLOGY REPORT*  Clinical Data: Rib pain.  LEFT RIBS AND CHEST - 3+ VIEW  Comparison:  Chest x-ray 07/07/2010.  Findings:  No fracture or other bone lesions are seen involving the ribs. There is no evidence of pneumothorax or pleural effusion. Both lungs are clear.  Heart size and mediastinal contours are within normal limits. Bilateral breast implants. Similar appearance to priors.  IMPRESSION: Negative.   Original Report Authenticated By: Davonna Belling, M.D.      1. Chest wall pain   2. Rib pain on left side       MDM  Overall exam seems most consistent with costochondritis given chest  wall tenderness. Currently, there are very few, if any coronary classical sxs that are present. Pt also with noted normal nuclear stress test 07/2010.  There was no noted intrathoracic process or bony deformity on imaging which is reassuring.  Pt is noted to have a prior hx/o PE, which puts her Wells Score >1.5. Vital signs are reassuring today with no hypoxia or tachycardia. However, given wells score, will check d-dimer and proceed accordingly to CTA if positive.  Discussed resp and general red flags at length.  Tylenol prn for pain.  Follow up with PCP in 5-7 days.      The patient and/or caregiver has been counseled thoroughly with regard to treatment plan and/or medications prescribed including dosage, schedule, interactions, rationale for use, and possible side effects and they verbalize understanding. Diagnoses and expected course of recovery discussed and will return if not improved as expected or if the condition worsens. Patient and/or caregiver verbalized understanding.             Doree Albee, MD 03/24/12 [phone]

## 2012-03-25 ENCOUNTER — Telehealth: Payer: Self-pay | Admitting: Family Medicine

## 2012-03-25 ENCOUNTER — Ambulatory Visit (HOSPITAL_BASED_OUTPATIENT_CLINIC_OR_DEPARTMENT_OTHER)
Admission: RE | Admit: 2012-03-25 | Discharge: 2012-03-25 | Disposition: A | Discharge status: Home / Self Care | Payer: Medicare Other | Source: Ambulatory Visit | Attending: Family Medicine | Admitting: Family Medicine

## 2012-03-25 DIAGNOSIS — R071 Chest pain on breathing: Secondary | ICD-10-CM | POA: Insufficient documentation

## 2012-03-25 DIAGNOSIS — R6889 Other general symptoms and signs: Secondary | ICD-10-CM | POA: Insufficient documentation

## 2012-03-25 MED ORDER — IOHEXOL 350 MG/ML SOLN
100.0000 mL | Freq: Once | INTRAVENOUS | Status: AC | PRN
  Administered 2012-03-25: 100 mL via INTRAVENOUS

## 2012-03-27 ENCOUNTER — Telehealth: Payer: Self-pay | Admitting: *Deleted

## 2012-04-12 ENCOUNTER — Other Ambulatory Visit: Payer: Self-pay | Admitting: Family Medicine

## 2012-04-13 ENCOUNTER — Ambulatory Visit (INDEPENDENT_AMBULATORY_CARE_PROVIDER_SITE_OTHER): Payer: Medicare Other | Admitting: Physician Assistant

## 2012-04-13 ENCOUNTER — Encounter (INDEPENDENT_AMBULATORY_CARE_PROVIDER_SITE_OTHER): Payer: Medicare Other | Admitting: General Surgery

## 2012-04-13 ENCOUNTER — Encounter: Payer: Self-pay | Admitting: Physician Assistant

## 2012-04-13 VITALS — BP 157/77 | HR 85 | Temp 98.0°F | Ht 60.0 in | Wt 132.0 lb

## 2012-04-13 DIAGNOSIS — Z7689 Persons encountering health services in other specified circumstances: Secondary | ICD-10-CM

## 2012-04-13 DIAGNOSIS — Z72820 Sleep deprivation: Secondary | ICD-10-CM

## 2012-04-13 DIAGNOSIS — Z23 Encounter for immunization: Secondary | ICD-10-CM

## 2012-04-13 DIAGNOSIS — R0989 Other specified symptoms and signs involving the circulatory and respiratory systems: Secondary | ICD-10-CM

## 2012-04-13 DIAGNOSIS — N318 Other neuromuscular dysfunction of bladder: Secondary | ICD-10-CM

## 2012-04-13 DIAGNOSIS — N3281 Overactive bladder: Secondary | ICD-10-CM | POA: Insufficient documentation

## 2012-04-13 DIAGNOSIS — R0683 Snoring: Secondary | ICD-10-CM

## 2012-04-13 DIAGNOSIS — R32 Unspecified urinary incontinence: Secondary | ICD-10-CM | POA: Insufficient documentation

## 2012-04-13 DIAGNOSIS — R590 Localized enlarged lymph nodes: Secondary | ICD-10-CM

## 2012-04-13 DIAGNOSIS — R599 Enlarged lymph nodes, unspecified: Secondary | ICD-10-CM

## 2012-04-13 DIAGNOSIS — I10 Essential (primary) hypertension: Secondary | ICD-10-CM

## 2012-04-13 LAB — CBC WITH DIFFERENTIAL/PLATELET
Basophils Absolute: 0 10*3/uL (ref 0.0–0.1)
Basophils Relative: 0 % (ref 0–1)
Eosinophils Absolute: 0.1 10*3/uL (ref 0.0–0.7)
Hemoglobin: 13 g/dL (ref 12.0–15.0)
MCH: 28.4 pg (ref 26.0–34.0)
MCHC: 33.7 g/dL (ref 30.0–36.0)
Monocytes Absolute: 0.5 10*3/uL (ref 0.1–1.0)
Monocytes Relative: 7 % (ref 3–12)
Neutrophils Relative %: 71 % (ref 43–77)
RDW: 14.3 % (ref 11.5–15.5)

## 2012-04-13 MED ORDER — LISINOPRIL-HYDROCHLOROTHIAZIDE 20-12.5 MG PO TABS: 1.0000 | ORAL_TABLET | Freq: Every day | ORAL | Status: DC

## 2012-04-13 MED ORDER — MIRABEGRON ER 25 MG PO TB24: 25.0000 mg | ORAL_TABLET | Freq: Every day | ORAL | Status: DC

## 2012-04-13 MED ORDER — MUPIROCIN 2 % EX OINT: TOPICAL_OINTMENT | Freq: Three times a day (TID) | CUTANEOUS | Status: DC

## 2012-04-13 NOTE — Progress Notes (Signed)
Subjective:    Patient ID: Deanna Wilson, female    DOB: 1936-03-21, 76 y.o.   MRN: [id number]  HPI Patient is a 76 yo female who presents to the clinic with right inguinal lymphadenopathy that has opened up and started to drain. She has seen Dr. Linford Arnold for this problem about 1 month ago. CT was done and supported lymphadenopathy. She was seen by General surgeon who wanted to wait and see if it would go away on its on. Her original CBC and peripheral blood smear was normal. It does appear to the patient that lymphnodes have decreased in size since original appt. The pain had been increasing until last night when one of them ruptured. He has since been draining a clear liquid. Patient denies any pain, fever, chills, warmth coming from area. Ever since the rupture there has been no pain. She has put nothing on it. She has continued to lose weight without trying. She has lost 9lbs since October. Pt does not want to see first general surgeon because of distance.   Her blood pressure is elevated today again. She was taken off her diuretic because it was causing her OAB symptoms to be worse. She denies any CP, palpitations, SOB, HA's, numbness or tingling, or dizziness. She has tried Ditropan in the past for OAB symptoms. Does not remember any side effects.  Patient wants to talk about a sleep study. Her daughter slept overnight with her in a hotel room and noticed she would stop breathing multiple times a night and make awful noises. She always remember snoring. Would like to be evaluated.      Review of Systems     Objective:   Physical Exam  Constitutional: She is oriented to person, place, and time. She appears well-developed and well-nourished.  HENT:  Head: Normocephalic and atraumatic.  Cardiovascular: Normal rate, regular rhythm and normal heart sounds.   Pulmonary/Chest: Effort normal and breath sounds normal. She has no wheezes.  Neurological: She is alert and oriented to person,  place, and time.  Skin: Skin is warm and dry.       5 1/2 cm by 1 1/2 cm sausage like lymphnodes that are harden with one side more flat and oozing clear discharge. No surrounding erythema, no color discharge, not warm to touch, and not tender to touch.   Psychiatric: She has a normal mood and affect. Her behavior is normal.          Assessment & Plan:  Right lymphadenopathy, inguinal- I put bactroban on area and bandage up. Gave patient bactroban to use for 7 days three times a day. I did not see any signs of infection today, however, Discussed warning signs of infection and if she had any to call office. Will repeat CBC and peripheral blood smear to make sure no changes. First was done at beginning of onset want to make sure not trending up. Will refer to a general surgeon closer for her to follow up with about the biopsy or removal.   Hypertension- BP elevated again after being taken off diuretic. Added diuretic back in today. Patient is to start new medication and to stop lisinopril regular. Patient told to monitor BP and make sure going down. When she follows up with specialist they will be monitoring also. Follow up in 3 months otherwise.  OAB/INcontience- Will start trial of Myrbetriq daily. Discussed with patient that this is better than Ditropan because of less potential side effects. Gave samples and 1 month. Pt  is to call if symptoms are better and wants another rx.   Snoring/Sleep Issues- Will order sleep study.   Flu shot given with no complications.

## 2012-04-13 NOTE — Patient Instructions (Addendum)
Will call with CBC results.  Use bactroban three times a day for 7 days. Call if would exhibit signs of infection which were discussed.   Will order sleep study. Will call with another general surgery appt.   Start back on Lisinopril with HCTZ. Start samples of Myrbetriq daily. Sent one month call if controlling symptoms.

## 2012-04-14 LAB — PATHOLOGIST SMEAR REVIEW

## 2012-05-11 ENCOUNTER — Encounter: Payer: Self-pay | Admitting: *Deleted

## 2012-05-13 ENCOUNTER — Other Ambulatory Visit: Payer: Self-pay | Admitting: Physician Assistant

## 2012-05-15 NOTE — Telephone Encounter (Signed)
Is this ok to fill. Not familiar with this med

## 2012-05-22 ENCOUNTER — Encounter (INDEPENDENT_AMBULATORY_CARE_PROVIDER_SITE_OTHER): Payer: Medicare Other | Admitting: Surgery

## 2012-07-06 ENCOUNTER — Telehealth: Payer: Self-pay | Admitting: *Deleted

## 2012-07-06 NOTE — Telephone Encounter (Signed)
Ok for xanax.  Med entered. Ok to print in AM, since I cant print from home.

## 2012-07-06 NOTE — Telephone Encounter (Signed)
Patient calls and states has been taking Lexapro for years but her grandson passed away 10/02/22 and not dealing with it well and request something to help with her anxiety for a few days. Send to [facility]

## 2012-07-07 MED ORDER — ALPRAZOLAM 0.5 MG PO TABS: 0.2500 mg | ORAL_TABLET | Freq: Two times a day (BID) | ORAL | Status: DC | PRN

## 2012-07-17 ENCOUNTER — Other Ambulatory Visit: Payer: Self-pay | Admitting: Family Medicine

## 2012-08-24 ENCOUNTER — Other Ambulatory Visit: Payer: Self-pay | Admitting: Family Medicine

## 2012-09-06 ENCOUNTER — Ambulatory Visit: Payer: Medicare Other | Admitting: Family Medicine

## 2012-09-07 ENCOUNTER — Encounter: Payer: Self-pay | Admitting: Family Medicine

## 2012-09-07 ENCOUNTER — Ambulatory Visit (INDEPENDENT_AMBULATORY_CARE_PROVIDER_SITE_OTHER): Payer: Medicare Other

## 2012-09-07 ENCOUNTER — Ambulatory Visit (INDEPENDENT_AMBULATORY_CARE_PROVIDER_SITE_OTHER): Payer: Medicare Other | Admitting: Family Medicine

## 2012-09-07 VITALS — BP 127/70 | HR 84 | Wt 132.0 lb

## 2012-09-07 DIAGNOSIS — M25559 Pain in unspecified hip: Secondary | ICD-10-CM

## 2012-09-07 DIAGNOSIS — M25551 Pain in right hip: Secondary | ICD-10-CM

## 2012-09-07 NOTE — Progress Notes (Signed)
CC: Deanna Wilson is a 77 y.o. female is here for right leg pain   Subjective: HPI:   Patient complains of right hip and leg pain. This has been present for 6-8 weeks. This came on gradually and has not gotten better or worse since onset. It is reproduced with walking most noticeable when right thigh is anywhere from midline to any degree extension. It improves with sitting. Nothing else seems to make it better or worse.  She has tried acetaminophen and piroxicam without any benefit.  She describes the pain as a deep mild to moderate discomfort in the right quadricep radiating up into the right groin.  She denies any preceding trauma or overexertion. Pain is present on a daily basis but does not interfere with sleep. She denies motor or sensory disturbances in the right lower extremities. She denies skin changes in the groin or right lower extremity. She denies weakness, falls, genitourinary complaints, GI disturbance, abdominal pain, nor pelvic pain. Denies back pain.   Review Of Systems Outlined In HPI  Past Medical History  Diagnosis Date  . Chest pain, atypical   . LBBB (left bundle branch block)   . Anxiety     unspecified  . Shoulder pain   . Benign positional vertigo   . Hemorrhoids, external   . Hypertension   . Depression     major, RCR, mild  . Allergy     rhinitis  . Osteoporosis     generalized, multiple sites  . Hypertriglyceridemia   . Diverticulitis of colon     NOS  . Fatty liver     on CT, G6, P3 A3 (mis)  . History of sarcoidosis   . Lumbar stenosis 6-08    at L4, L MRI, saw Dr Blanche East  . Sarcoidosis   . Pulmonary embolism   . Cancer      Family History  Problem Relation Age of Onset  . Cancer Mother     Breast  . Cancer Sister     breast  . Cancer Sister     breast  . Cancer Sister     breast  . Cancer Sister     breast  . Cancer Other     breast/breast/breast  . Aplastic anemia Grandchild 10     History  Substance Use Topics  . Smoking  status: Never Smoker   . Smokeless tobacco: Not on file  . Alcohol Use: No     Objective: Filed Vitals:   09/07/12 0823  BP: 127/70  Pulse: 84   Vital signs reviewed. General: Alert and Oriented, No Acute Distress HEENT: Pupils equal, round, reactive to light. Conjunctivae clear.  External ears unremarkable.  Moist mucous membranes. Lungs: Clear and comfortable work of breathing, speaking in full sentences without accessory muscle use. Cardiac: Regular rate and rhythm.  Neuro: CN II-XII grossly intact, gait normal. Extremities: No peripheral edema.  Strong peripheral pulses.  Right lower extremity: L4 and S1 DTRs two over four symmetric to left. Full strength and range of motion throughout entire extremity. Pain is reproduced with resisted hip flexion. Log roll reproduces pain at endpoints of external rotation. FABIR reproduces pain, negative FADIR and SLR.  No reproduction of pain or palpable masses with palpation of right groin in no overlying skin changes. No pain with palpation over anterior brim of right pelvis. Mental Status: No depression, anxiety, nor agitation. Logical though process. Skin: Warm and dry.  Assessment & Plan: Deanna Wilson was seen today for right leg  pain.  Diagnoses and associated orders for this visit:  Right hip pain - DG Hip Complete Right; Future    Given history of osteoporosis plain films were obtained showing osteoarthritis of the femoral acetabular joint but primarily rule out fracture. Images were reviewed independently by myself and radiology report reviewed. Suspect hip flexor strain, offered formal PT versus home PT. Patient would prefer home physical therapy and through her own gym, handout was provided with rehabilitation with exercises demonstrated.  If not improved in 2 weeks call for referral to formal PT  Return if symptoms worsen or fail to improve.

## 2012-09-18 ENCOUNTER — Ambulatory Visit (INDEPENDENT_AMBULATORY_CARE_PROVIDER_SITE_OTHER): Payer: Medicare Other | Admitting: Family Medicine

## 2012-09-18 ENCOUNTER — Encounter: Payer: Self-pay | Admitting: Family Medicine

## 2012-09-18 VITALS — BP 104/61 | HR 102 | Ht 63.0 in | Wt 132.0 lb

## 2012-09-18 DIAGNOSIS — M25551 Pain in right hip: Secondary | ICD-10-CM

## 2012-09-18 DIAGNOSIS — M25559 Pain in unspecified hip: Secondary | ICD-10-CM

## 2012-09-18 MED ORDER — HYDROCODONE-ACETAMINOPHEN 5-325 MG PO TABS: 2.0000 | ORAL_TABLET | Freq: Four times a day (QID) | ORAL | Status: DC | PRN

## 2012-09-18 NOTE — Progress Notes (Signed)
Subjective:    Patient ID: Deanna Wilson, female    DOB: [birth year], 77 y.o.   MRN: [id number]  HPI Had cyst removed from right groin in December and did wll. No healing problems.  Says it was a water encased cyst. Says right hip really started hurting a couple of months ago. Says if twists a certaine way it was unbearable. Says hard to walk on it.  Had xray.  It was radiating into anterior thigh and now mostly in the  groin crease. Takes piroxicam daily. No other alleviating factors. Worse with activity and with standing on it. She denies any stiffness in the joint. I did review the x-ray report in the system that was ordered by Dr. Laren Boom.   Review of Systems BP 104/61  Pulse 102  Ht 5\' 3"  (1.6 m)  Wt 132 lb (59.875 kg)  BMI 23.39 kg/m2    No Known Allergies  Past Medical History  Diagnosis Date  . Chest pain, atypical   . LBBB (left bundle branch block)   . Anxiety     unspecified  . Shoulder pain   . Benign positional vertigo   . Hemorrhoids, external   . Hypertension   . Depression     major, RCR, mild  . Allergy     rhinitis  . Osteoporosis     generalized, multiple sites  . Hypertriglyceridemia   . Diverticulitis of colon     NOS  . Fatty liver     on CT, G6, P3 A3 (mis)  . History of sarcoidosis   . Lumbar stenosis 6-08    at L4, L MRI, saw Dr Blanche East  . Sarcoidosis   . Pulmonary embolism   . Cancer     Past Surgical History  Procedure Laterality Date  . Mastectomy  1975    bilat- prophylactic  . Bilateral oophorectomy  1982  . Carpel tunnel  1990  . Cholecystectomy  1991  . Abdominal hysterectomy  1980    fibroids  . Pft's      no obstruction  . Breast surgery  1975    saline implants  . Partial colectomy  1989    for diverticulitis    History   Social History  . Marital Status: Widowed    Spouse Name: N/A    Number of Children: N/A  . Years of Education: N/A   Occupational History  . Not on file.   Social History Main Topics   . Smoking status: Never Smoker   . Smokeless tobacco: Not on file  . Alcohol Use: No  . Drug Use: No  . Sexually Active:    Other Topics Concern  . Not on file   Social History Narrative  . No narrative on file    Family History  Problem Relation Age of Onset  . Cancer Mother     Breast  . Cancer Sister     breast  . Cancer Sister     breast  . Cancer Sister     breast  . Cancer Sister     breast  . Cancer Other     breast/breast/breast  . Aplastic anemia Grandchild 10    Outpatient Encounter Prescriptions as of 09/18/2012  Medication Sig Dispense Refill  . Calcium Carbonate-Vitamin D (CALCIUM + D PO) Take 1 tablet by mouth 2 (two) times daily.        . lansoprazole (PREVACID) 30 MG capsule TAKE (1) CAPSULE DAILY.  30 capsule  3  .  lisinopril-hydrochlorothiazide (PRINZIDE,ZESTORETIC) 20-12.5 MG per tablet Take 1 tablet by mouth daily.  30 tablet  5  . Multiple Vitamins-Minerals (MULTIVITAMIN) tablet Take 1 tablet by mouth daily.  90 tablet  3  . multivitamin (THERAGRAN) tablet Take 1 tablet by mouth daily.        Marland Kitchen MYRBETRIQ 25 MG TB24 TAKE 1 TABLET DAILY.  30 tablet  3  . piroxicam (FELDENE) 10 MG capsule TAKE  (1)  CAPSULE  TWICE DAILY.  60 capsule  1  . Probiotic Product (ALIGN PO) Take 1 tablet by mouth daily.        Marland Kitchen HYDROcodone-acetaminophen (NORCO/VICODIN) 5-325 MG per tablet Take 2 tablets by mouth every 6 (six) hours as needed for pain.  20 tablet  0  . [DISCONTINUED] ALPRAZolam (XANAX) 0.5 MG tablet Take 0.5-1 tablets (0.25-0.5 mg total) by mouth 2 (two) times daily as needed for anxiety.  30 tablet  0   No facility-administered encounter medications on file as of 09/18/2012.          Objective:   Physical Exam  Musculoskeletal:  Right hip with pain with inversion and eversion.  Pain with flexion against resistance.  Slightly weaker compared to the left.  Knee and ankle strength is 5/5 on the right  And left. Patellar reflexes symmetric.    Skin:   Incision of her right groin creases well. No palpable lesions or masses.          Assessment & Plan:  Right hip pain - she is in significant discomfort. Would like her to see my partner Dr. Rodney Langton for a hip injection to see if this provides him for pain relief. At that point the he consider physical therapy etc. to get her better. I did give her a small quantity of hydrocodone to use at bedtime. Continue to use anti-inflammatory. Make sure to take with food and water to avoid any GI upset.

## 2012-09-19 ENCOUNTER — Ambulatory Visit (INDEPENDENT_AMBULATORY_CARE_PROVIDER_SITE_OTHER): Payer: Medicare Other | Admitting: Sports Medicine

## 2012-09-19 ENCOUNTER — Encounter: Payer: Self-pay | Admitting: Sports Medicine

## 2012-09-19 VITALS — BP 123/51 | HR 99 | Wt 134.0 lb

## 2012-09-19 DIAGNOSIS — M169 Osteoarthritis of hip, unspecified: Secondary | ICD-10-CM | POA: Insufficient documentation

## 2012-09-19 DIAGNOSIS — M1610 Unilateral primary osteoarthritis, unspecified hip: Secondary | ICD-10-CM

## 2012-09-19 NOTE — Assessment & Plan Note (Signed)
Ultrasound-guided femoral acetabular joint injection as above. Continue hip flexor rehabilitation. Return in 4 weeks. She is also having some knee pain, I do suspect osteoarthritis, home exercises given.

## 2012-09-19 NOTE — Progress Notes (Signed)
   Subjective:    I'm seeing this patient as a consultation for:  Dr. Linford Arnold  CC: Right groin pain  HPI: This is an extremely pleasant 77 year old female with a several year history of pain that she localizes in her right groin. She was seen by another physician, and prescribed medication as well as home exercises. This helped only slightly, and she was sent to me for consideration of interventional treatment. 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 It does not radiate, mild to moderate. Piroxicam has not been effective in controlling her pain.  Past medical history, Surgical history, Family history not pertinant except as noted below, Social history, Allergies, and medications have been entered into the medical record, reviewed, and no changes needed.   Review of Systems: No headache, visual changes, nausea, vomiting, diarrhea, constipation, dizziness, abdominal pain, skin rash, fevers, chills, night sweats, weight loss, swollen lymph nodes, body aches, joint swelling, muscle aches, chest pain, shortness of breath, mood changes, visual or auditory hallucinations.   Objective:   General: Well Developed, well nourished, and in no acute distress.  Neuro/Psych: Alert and oriented x3, extra-ocular muscles intact, able to move all 4 extremities, sensation grossly intact. Skin: Warm and dry, no rashes noted.  Respiratory: Not using accessory muscles, speaking in full sentences, trachea midline.  Cardiovascular: Pulses palpable, no extremity edema. Abdomen: Does not appear distended. Right Hip: ROM IR: 15 Deg, ER: 45 Deg, Flexion: 120 Deg, Extension: 100 Deg, Abduction: 45 Deg, Adduction: 45 Deg, reproduction of pain with internal rotation of the hip. Strength IR: 5/5, ER: 5/5, Flexion: 5/5, Extension: 5/5, Abduction: 5/5, Adduction: 5/5 Pelvic alignment unremarkable to inspection and palpation. Standing hip  rotation and gait without trendelenburg sign / unsteadiness. Greater trochanter without tenderness to palpation. No tenderness over piriformis and greater trochanter. No pain with FABER or FADIR. No SI joint tenderness and normal minimal SI movement.  X-rays reviewed, there is mild to moderate femoroacetabular joint DJD bilaterally.  Procedure: Real-time Ultrasound Guided Injection of right femoroacetabular joint Device: GE Logiq E  Ultrasound guided injection is preferred based studies that show increased duration, increased effect, greater accuracy, decreased procedural pain, increased response rate, and decreased cost with ultrasound guided versus blind injection.  Verbal informed consent obtained.  Time-out conducted.  Noted no overlying erythema, induration, or other signs of local infection.  Skin prepped in a sterile fashion.  Local anesthesia: Topical Ethyl chloride.  With sterile technique and under real time ultrasound guidance:  Spinal needle advanced to the femoral head/neck junction, 2 cc Kenalog 40, 4 cc lidocaine injected easily. Completed without difficulty  Pain immediately resolved suggesting accurate placement of the medication.  Advised to call if fevers/chills, erythema, induration, drainage, or persistent bleeding.  Images permanently stored and available for review in the ultrasound unit.  Impression: Technically successful ultrasound guided injection.  Impression and Recommendations:   This case required medical decision making of moderate complexity.

## 2012-09-25 ENCOUNTER — Other Ambulatory Visit: Payer: Self-pay | Admitting: Family Medicine

## 2012-09-28 ENCOUNTER — Encounter: Payer: Self-pay | Admitting: Family Medicine

## 2012-09-28 ENCOUNTER — Ambulatory Visit (INDEPENDENT_AMBULATORY_CARE_PROVIDER_SITE_OTHER): Payer: Medicare Other

## 2012-09-28 ENCOUNTER — Ambulatory Visit (INDEPENDENT_AMBULATORY_CARE_PROVIDER_SITE_OTHER): Payer: Medicare Other | Admitting: Family Medicine

## 2012-09-28 VITALS — BP 116/58 | HR 92 | Wt 131.0 lb

## 2012-09-28 DIAGNOSIS — M25569 Pain in unspecified knee: Secondary | ICD-10-CM

## 2012-09-28 DIAGNOSIS — M25551 Pain in right hip: Secondary | ICD-10-CM

## 2012-09-28 DIAGNOSIS — M25559 Pain in unspecified hip: Secondary | ICD-10-CM

## 2012-09-28 DIAGNOSIS — M25561 Pain in right knee: Secondary | ICD-10-CM

## 2012-09-28 DIAGNOSIS — M25469 Effusion, unspecified knee: Secondary | ICD-10-CM

## 2012-09-28 MED ORDER — GABAPENTIN 100 MG PO CAPS: 100.0000 mg | ORAL_CAPSULE | Freq: Every day | ORAL | Status: DC

## 2012-09-28 NOTE — Progress Notes (Signed)
Subjective:    Patient ID: Deanna Wilson, female    DOB: 29-Oct-1935, 77 y.o.   MRN: [id number]  HPI Still having right groin even after injection.  She is still suspicious of the procedure that she had removed this from the right going actually been the cause of her pain. The she does have some mild degenerative arthritis of that right hip and she somewhat partner Dr. Rodney Langton who also felt like there was some hip flexor element to her pain. She reports that the injection helped about 25%. Her biggest concern now is that she is having right knee pain.  Says it is more severe than the hip pain now. .  Right now the knee is 5/10. Last night is 8/10 and kep her awake. Feels like the pain meds helps some but not a lot and makes her sleepy.  No swelling. Right knee is painful to walk on. She says she would rathre have surgery then have to take all "these pain meds".     Review of Systems BP 116/58  Pulse 92  Wt 131 lb (59.421 kg)  BMI 23.21 kg/m2    No Known Allergies  Past Medical History  Diagnosis Date  . Chest pain, atypical   . LBBB (left bundle branch block)   . Anxiety     unspecified  . Shoulder pain   . Benign positional vertigo   . Hemorrhoids, external   . Hypertension   . Depression     major, RCR, mild  . Allergy     rhinitis  . Osteoporosis     generalized, multiple sites  . Hypertriglyceridemia   . Diverticulitis of colon     NOS  . Fatty liver     on CT, G6, P3 A3 (mis)  . History of sarcoidosis   . Lumbar stenosis 6-08    at L4, L MRI, saw Dr Blanche East  . Sarcoidosis   . Pulmonary embolism   . Cancer     Past Surgical History  Procedure Laterality Date  . Mastectomy  1975    bilat- prophylactic  . Bilateral oophorectomy  1982  . Carpel tunnel  1990  . Cholecystectomy  1991  . Abdominal hysterectomy  1980    fibroids  . Pft's      no obstruction  . Breast surgery  1975    saline implants  . Partial colectomy  1989    for diverticulitis     History   Social History  . Marital Status: Widowed    Spouse Name: N/A    Number of Children: N/A  . Years of Education: N/A   Occupational History  . Not on file.   Social History Main Topics  . Smoking status: Never Smoker   . Smokeless tobacco: Not on file  . Alcohol Use: No  . Drug Use: No  . Sexually Active:    Other Topics Concern  . Not on file   Social History Narrative  . No narrative on file    Family History  Problem Relation Age of Onset  . Cancer Mother     Breast  . Cancer Sister     breast  . Cancer Sister     breast  . Cancer Sister     breast  . Cancer Sister     breast  . Cancer Other     breast/breast/breast  . Aplastic anemia Grandchild 10    Outpatient Encounter Prescriptions as of 09/28/2012  Medication Sig  Dispense Refill  . Calcium Carbonate-Vitamin D (CALCIUM + D PO) Take 1 tablet by mouth 2 (two) times daily.        Marland Kitchen HYDROcodone-acetaminophen (NORCO/VICODIN) 5-325 MG per tablet Take 2 tablets by mouth every 6 (six) hours as needed for pain.  20 tablet  0  . lansoprazole (PREVACID) 30 MG capsule TAKE (1) CAPSULE DAILY.  30 capsule  3  . lisinopril-hydrochlorothiazide (PRINZIDE,ZESTORETIC) 20-12.5 MG per tablet Take 1 tablet by mouth daily.  30 tablet  5  . Multiple Vitamins-Minerals (MULTIVITAMIN) tablet Take 1 tablet by mouth daily.  90 tablet  3  . multivitamin (THERAGRAN) tablet Take 1 tablet by mouth daily.        . piroxicam (FELDENE) 10 MG capsule TAKE  (1)  CAPSULE  TWICE DAILY.  60 capsule  1  . Probiotic Product (ALIGN PO) Take 1 tablet by mouth daily.        Marland Kitchen gabapentin (NEURONTIN) 100 MG capsule Take 1-2 capsules (100-200 mg total) by mouth at bedtime.  60 capsule  0  . [DISCONTINUED] MYRBETRIQ 25 MG TB24 TAKE 1 TABLET DAILY.  30 tablet  3   No facility-administered encounter medications on file as of 09/28/2012.          Objective:   Physical Exam  Constitutional: She appears well-developed and well-nourished.   HENT:  Head: Normocephalic and atraumatic.  Musculoskeletal:  Right knee pain right knee with normal flexion and extension. She's tender along the medial joint line and just below the patella. She does have some significant crepitus. Negative McMurray's. No laxity with valgus and varus stress, or with anterior-posterior drawer test. Strength is 5 out of 5.  Skin: Skin is warm and dry.  Psychiatric: She has a normal mood and affect. Her behavior is normal.          Assessment & Plan:  Right knee pain - Likey OA basedon exam today. There consider a cartilage or meniscal tear if her x-ray is fairly normal. Her pain is quite severe. Consider orthopedic referral if she does have more moderate to severe osteoarthritis in the knee. For now can continue to use hydrocodone as needed as well as anti-inflammatory. Make sure take with food and water to avoid any GI upset.  Right hip pain-secondary to osteoarthritis as well as some right hip flexor strain. She has had about 25% improvement after the injection but is still having significant pain. I don't think this is related to her recent surgical procedure to remove the cyst but certainly that is possible. It can cause some peripheral nerve damage. We could consider a trial of Neurontin at bedtime to see if this helps relieve her pain and helps improve her sleep quality at night. Warned about potential sedation with this medication and that she can take a couple hours before bedtime if needed.

## 2012-10-05 ENCOUNTER — Encounter: Payer: Self-pay | Admitting: Family Medicine

## 2012-10-05 ENCOUNTER — Ambulatory Visit (INDEPENDENT_AMBULATORY_CARE_PROVIDER_SITE_OTHER): Payer: Medicare Other | Admitting: Family Medicine

## 2012-10-05 VITALS — BP 123/58 | HR 117 | Temp 98.9°F | Wt 135.0 lb

## 2012-10-05 DIAGNOSIS — J019 Acute sinusitis, unspecified: Secondary | ICD-10-CM

## 2012-10-05 DIAGNOSIS — J209 Acute bronchitis, unspecified: Secondary | ICD-10-CM

## 2012-10-05 MED ORDER — HYDROCODONE-HOMATROPINE 5-1.5 MG/5ML PO SYRP: 5.0000 mL | ORAL_SOLUTION | Freq: Every evening | ORAL | Status: DC | PRN

## 2012-10-05 MED ORDER — AZITHROMYCIN 250 MG PO TABS: ORAL_TABLET | ORAL | Status: DC

## 2012-10-05 NOTE — Progress Notes (Signed)
  Subjective:    Patient ID: Deanna Wilson, female    DOB: Sep 13, 1935, 77 y.o.   MRN: [id number]  HPI Runny nose for 2 days and now with very deep croupy wet cough. Painful to cough. Ches  hurts. No Ffever.  No bodyaches.  Took mucinex. Feels SOB.  No ear pain or ST.  No GI sxs.  Nasal sinsus are very congested.  She is at increased risk because she does have a history of sarcoidosis. She's not currently on any immunosuppressive therapy.   Review of Systems     Objective:   Physical Exam  Constitutional: She is oriented to person, place, and time. She appears well-developed and well-nourished.  HENT:  Head: Normocephalic and atraumatic.  Right Ear: External ear normal.  Left Ear: External ear normal.  Nose: Nose normal.  Mouth/Throat: Oropharynx is clear and moist.  TMs and canals are clear.   Eyes: Conjunctivae and EOM are normal. Pupils are equal, round, and reactive to light.  Neck: Neck supple. No thyromegaly present.  Cardiovascular: Normal rate, regular rhythm and normal heart sounds.   Pulmonary/Chest: Effort normal and breath sounds normal. She has no wheezes.  Lymphadenopathy:    She has no cervical adenopathy.  Neurological: She is alert and oriented to person, place, and time.  Skin: Skin is warm and dry.  Psychiatric: She has a normal mood and affect.    She does have a very deep, wet bronchitic type cough.      Assessment & Plan:  Sinusitis with cough - could be early viral but with her rapid onset of symptoms I would like to homicidal given her prescription for antibiotics to fill if she is certainly feeling worse, runs a temperature, or if not better in the next 4-5 days. Then her prescription for azithromycin. She tends to tolerate this well. She does get a lot of GI side effects from antibiotics. She is afebrile and pulse ox is fairly normal which is reassuring. Certainly she gets worse then please call us back we can consider getting a chest x-ray.

## 2012-10-09 ENCOUNTER — Encounter: Payer: Self-pay | Admitting: Sports Medicine

## 2012-10-09 ENCOUNTER — Ambulatory Visit (INDEPENDENT_AMBULATORY_CARE_PROVIDER_SITE_OTHER): Payer: Medicare Other | Admitting: Sports Medicine

## 2012-10-09 VITALS — BP 96/60 | HR 76 | Wt 130.0 lb

## 2012-10-09 DIAGNOSIS — M1610 Unilateral primary osteoarthritis, unspecified hip: Secondary | ICD-10-CM

## 2012-10-09 DIAGNOSIS — M169 Osteoarthritis of hip, unspecified: Secondary | ICD-10-CM

## 2012-10-09 DIAGNOSIS — M1711 Unilateral primary osteoarthritis, right knee: Secondary | ICD-10-CM | POA: Insufficient documentation

## 2012-10-09 DIAGNOSIS — M1710 Unilateral primary osteoarthritis, unspecified knee: Secondary | ICD-10-CM

## 2012-10-09 DIAGNOSIS — IMO0002 Reserved for concepts with insufficient information to code with codable children: Secondary | ICD-10-CM

## 2012-10-09 NOTE — Progress Notes (Addendum)
  Subjective:    CC: Knee pain  HPI: Right knee pain: Recent x-ray showed an effusion, but no mention of osteoarthritis. Pain is localized under the patella, and worse with weightbearing, with pain also over the medial tibial plateau. It does not radiate, moderate. Has been present for weeks.  Right hip osteoarthritis: I injected her right femoral acetabular joint approximately one month ago. It worked temporarily but unfortunately some of the pain has recurred. Pain is however approximately 50% decreased after the injection. Pain is localized in the groin, worse with weightbearing, stable. Moderate.  Past medical history, Surgical history, Family history not pertinant except as noted below, Social history, Allergies, and medications have been entered into the medical record, reviewed, and no changes needed.   Review of Systems: No fevers, chills, night sweats, weight loss, chest pain, or shortness of breath.   Objective:    General: Well Developed, well nourished, and in no acute distress.  Neuro: Alert and oriented x3, extra-ocular muscles intact, sensation grossly intact.  HEENT: Normocephalic, atraumatic, pupils equal round reactive to light, neck supple, no masses, no lymphadenopathy, thyroid nonpalpable.  Skin: Warm and dry, no rashes. Cardiac: Regular rate and rhythm, no murmurs rubs or gallops, no lower extremity edema.  Respiratory: Clear to auscultation bilaterally. Not using accessory muscles, speaking in full sentences. Right Knee: Normal to inspection with no erythema or effusion or obvious bony abnormalities. Minimal medial joint line pain. ROM full in flexion and extension and lower leg rotation. Ligaments with solid consistent endpoints including ACL, PCL, LCL, MCL. Negative Mcmurray's, Apley's, and Thessalonian tests. Non painful patellar compression. Patellar glide with crepitus. Patellar and quadriceps tendons unremarkable. Hamstring and quadriceps strength is normal.    Procedure: Real-time Ultrasound Guided Injection of right femoroacetabular joint Device: GE Logiq E  Ultrasound guided injection is preferred based studies that show increased duration, increased effect, greater accuracy, decreased procedural pain, increased response rate, and decreased cost with ultrasound guided versus blind injection.  Verbal informed consent obtained.  Time-out conducted.  Noted no overlying erythema, induration, or other signs of local infection.  Skin prepped in a sterile fashion.  Local anesthesia: Topical Ethyl chloride.  With sterile technique and under real time ultrasound guidance:  Spinal needle advanced to the femoral head/neck junction, 2 cc Kenalog 40, 4 cc lidocaine injected easily. Completed without difficulty  Pain immediately resolved suggesting accurate placement of the medication.  Advised to call if fevers/chills, erythema, induration, drainage, or persistent bleeding.  Images permanently stored and available for review in the ultrasound unit.  Impression: Technically successful ultrasound guided injection.  Procedure: Real-time Ultrasound Guided Injection of right suprapatellar recess Device: GE Logiq E  Ultrasound guided injection is preferred based studies that show increased duration, increased effect, greater accuracy, decreased procedural pain, increased response rate, and decreased cost with ultrasound guided versus blind injection.  Verbal informed consent obtained.  Time-out conducted.  Noted no overlying erythema, induration, or other signs of local infection.  Skin prepped in a sterile fashion.  Local anesthesia: Topical Ethyl chloride.  With sterile technique and under real time ultrasound guidance:  2 cc Kenalog 40, 4 cc lidocaine injected easily. Completed without difficulty  Pain did not immediately resolve. Advised to call if fevers/chills, erythema, induration, drainage, or persistent bleeding.  Images permanently stored and  available for review in the ultrasound unit.  Impression: Technically successful ultrasound guided injection.  Impression and Recommendations:

## 2012-10-09 NOTE — Assessment & Plan Note (Signed)
Injected under guidance. Effusion was minimal.

## 2012-10-09 NOTE — Assessment & Plan Note (Signed)
Temporary relief with first injection. Repeat injection performed today in the right femoroacetabular joint.

## 2012-10-13 ENCOUNTER — Ambulatory Visit (INDEPENDENT_AMBULATORY_CARE_PROVIDER_SITE_OTHER): Payer: Medicare Other | Admitting: Family Medicine

## 2012-10-13 ENCOUNTER — Ambulatory Visit (INDEPENDENT_AMBULATORY_CARE_PROVIDER_SITE_OTHER): Payer: Medicare Other

## 2012-10-13 ENCOUNTER — Encounter: Payer: Self-pay | Admitting: Family Medicine

## 2012-10-13 VITALS — BP 127/63 | HR 82 | Wt 131.0 lb

## 2012-10-13 DIAGNOSIS — R209 Unspecified disturbances of skin sensation: Secondary | ICD-10-CM

## 2012-10-13 DIAGNOSIS — R202 Paresthesia of skin: Secondary | ICD-10-CM

## 2012-10-13 DIAGNOSIS — M545 Low back pain, unspecified: Secondary | ICD-10-CM

## 2012-10-13 MED ORDER — OXYCODONE-ACETAMINOPHEN 5-325 MG PO TABS: 1.0000 | ORAL_TABLET | ORAL | Status: DC | PRN

## 2012-10-13 MED ORDER — PREDNISONE 20 MG PO TABS: ORAL_TABLET | ORAL | Status: DC

## 2012-10-13 NOTE — Patient Instructions (Signed)
Stop the piroxicam while on prednisone.  Make sure to take with food and water.

## 2012-10-13 NOTE — Progress Notes (Signed)
Subjective:    Patient ID: Deanna Wilson, female    DOB: 04-11-1936, 77 y.o.   MRN: [id number]  HPI Right low back pain that started x 4 days ago. Pain in low right back and right thigh when lifts. Leg.Pain meds not hleping.  Doesn't pass the ankle. She says she's had to use a walker but has been so painful to ambulate. She says she really can't get comfortable. Her pain at its best is an 8/10 and currently is a 10 out of 10. She says the hydrocodone is not taking her pain. She says this is very different from the pain she is experiencing in her hip and her knee. Back she just had an injection about the right hip and the right knee about 4 days ago and says that is actually better. She feels like this is a pinched nerve. She denies any known injuries or trauma to her low back. She does have significant osteoarthritis in multiple places in her body. She did take some leftover cough medicine at bedtime last night and it did help her rest. The pain has been keeping her awake at night. Pain is 10/10.     Review of Systems BP 127/63  Pulse 82  Wt 131 lb (59.421 kg)  BMI 23.21 kg/m2    No Known Allergies  Past Medical History  Diagnosis Date  . Chest pain, atypical   . LBBB (left bundle branch block)   . Anxiety     unspecified  . Shoulder pain   . Benign positional vertigo   . Hemorrhoids, external   . Hypertension   . Depression     major, RCR, mild  . Allergy     rhinitis  . Osteoporosis     generalized, multiple sites  . Hypertriglyceridemia   . Diverticulitis of colon     NOS  . Fatty liver     on CT, G6, P3 A3 (mis)  . History of sarcoidosis   . Lumbar stenosis 6-08    at L4, L MRI, saw Dr Blanche East  . Sarcoidosis   . Pulmonary embolism   . Cancer     Past Surgical History  Procedure Laterality Date  . Mastectomy  1975    bilat- prophylactic  . Bilateral oophorectomy  1982  . Carpel tunnel  1990  . Cholecystectomy  1991  . Abdominal hysterectomy  1980    fibroids   . Pft's      no obstruction  . Breast surgery  1975    saline implants  . Partial colectomy  1989    for diverticulitis    History   Social History  . Marital Status: Widowed    Spouse Name: N/A    Number of Children: N/A  . Years of Education: N/A   Occupational History  . Not on file.   Social History Main Topics  . Smoking status: Never Smoker   . Smokeless tobacco: Not on file  . Alcohol Use: No  . Drug Use: No  . Sexually Active:    Other Topics Concern  . Not on file   Social History Narrative  . No narrative on file    Family History  Problem Relation Age of Onset  . Cancer Mother     Breast  . Cancer Sister     breast  . Cancer Sister     breast  . Cancer Sister     breast  . Cancer Sister     breast  .  Cancer Other     breast/breast/breast  . Aplastic anemia Grandchild 10    Outpatient Encounter Prescriptions as of 10/13/2012  Medication Sig Dispense Refill  . Calcium Carbonate-Vitamin D (CALCIUM + D PO) Take 1 tablet by mouth 2 (two) times daily.        Marland Kitchen gabapentin (NEURONTIN) 100 MG capsule Take 1-2 capsules (100-200 mg total) by mouth at bedtime.  60 capsule  0  . lansoprazole (PREVACID) 30 MG capsule TAKE (1) CAPSULE DAILY.  30 capsule  3  . lisinopril-hydrochlorothiazide (PRINZIDE,ZESTORETIC) 20-12.5 MG per tablet Take 1 tablet by mouth daily.  30 tablet  5  . multivitamin (THERAGRAN) tablet Take 1 tablet by mouth daily.        . piroxicam (FELDENE) 10 MG capsule TAKE  (1)  CAPSULE  TWICE DAILY.  60 capsule  1  . [DISCONTINUED] HYDROcodone-acetaminophen (NORCO/VICODIN) 5-325 MG per tablet Take 2 tablets by mouth every 6 (six) hours as needed for pain.  20 tablet  0  . oxyCODONE-acetaminophen (PERCOCET/ROXICET) 5-325 MG per tablet Take 1 tablet by mouth every 4 (four) hours as needed for pain.  60 tablet  0  . predniSONE (DELTASONE) 20 MG tablet 2 tabs QD x 5 days, then 1 tab QD x 5 days.  15 tablet  0  . [DISCONTINUED] azithromycin  (ZITHROMAX) 250 MG tablet 2 tabs Day 1, then one a days x 4 days.  6 each  0  . [DISCONTINUED] HYDROcodone-homatropine (HYCODAN) 5-1.5 MG/5ML syrup Take 5 mLs by mouth at bedtime as needed for cough.  180 mL  0   No facility-administered encounter medications on file as of 10/13/2012.          Objective:   Physical Exam  Constitutional: She appears well-developed and well-nourished. She appears distressed.  Skin: Skin is warm and dry.  Psychiatric: She has a normal mood and affect. Her behavior is normal.   Decreased lumbar flexion and extension. She's able to rotate right and left with any palms or difficulty. She's extremely tender over the right SI joint and over the right paraspinous muscles. Nontender of the actual lumbar spine. She has significant pain and is unable to resist flexion at the right hip because of discomfort in her back. She says it's more painful to lift her knees sitting that is to do it standing. No swelling of the lower Schimke's.       Assessment & Plan:  Right low back pain-will get x-rays today. Suspect herniated disc or possible spondylolisthesis. We'll call her with the results are available. Given Toradol 60 mg IM for acute relief. He'll also change her hydrocodone to oxycodone. Handwritten prescription given. Call if significantly worse or not improving. We did take her to x-ray in a wheelchair as it is very difficult for her to walk today. She came in using a walker which she does not normally use. She will need her piroxicam while she takes the prednisone. Make sure take with food and water. Stop immediately if any GI side effects. Call if she's having problems with mood swings or sleep well on the prednisone.

## 2012-11-06 ENCOUNTER — Ambulatory Visit: Payer: Medicare Other | Admitting: Sports Medicine

## 2012-11-06 ENCOUNTER — Other Ambulatory Visit: Payer: Self-pay | Admitting: *Deleted

## 2012-11-07 ENCOUNTER — Other Ambulatory Visit: Payer: Self-pay | Admitting: *Deleted

## 2012-11-07 MED ORDER — LISINOPRIL-HYDROCHLOROTHIAZIDE 20-12.5 MG PO TABS: 1.0000 | ORAL_TABLET | Freq: Every day | ORAL | Status: DC

## 2012-12-07 ENCOUNTER — Ambulatory Visit: Payer: Medicare Other | Admitting: Family Medicine

## 2012-12-08 ENCOUNTER — Encounter: Payer: Self-pay | Admitting: Family Medicine

## 2012-12-08 ENCOUNTER — Ambulatory Visit (INDEPENDENT_AMBULATORY_CARE_PROVIDER_SITE_OTHER): Payer: Medicare Other | Admitting: Family Medicine

## 2012-12-08 VITALS — BP 140/77 | HR 84 | Wt 132.0 lb

## 2012-12-08 DIAGNOSIS — Z780 Asymptomatic menopausal state: Secondary | ICD-10-CM

## 2012-12-08 DIAGNOSIS — M79609 Pain in unspecified limb: Secondary | ICD-10-CM

## 2012-12-08 DIAGNOSIS — I10 Essential (primary) hypertension: Secondary | ICD-10-CM

## 2012-12-08 DIAGNOSIS — R7989 Other specified abnormal findings of blood chemistry: Secondary | ICD-10-CM

## 2012-12-08 DIAGNOSIS — E781 Pure hyperglyceridemia: Secondary | ICD-10-CM

## 2012-12-08 DIAGNOSIS — IMO0002 Reserved for concepts with insufficient information to code with codable children: Secondary | ICD-10-CM

## 2012-12-08 DIAGNOSIS — M79604 Pain in right leg: Secondary | ICD-10-CM

## 2012-12-08 NOTE — Progress Notes (Signed)
  Subjective:    Patient ID: Deanna Wilson, female    DOB: [birth year], 77 y.o.   MRN: [id number]  HPI  F/u right leg pain - We did an xray and it was normal. Her pain got severe and she went to [facility] emergency department. They did do an MRI there. Evidently the results were abnormal. Someone here in our staff call for results but we have not received them. She's here today for followup.She was told had an inflammed sciatic nerve.  Having hard time flexion leg to 90 degrees. Pain in the right inner right thigh. Has really affected her quality of life. She says the week she went to the ED she laid in bed almost all week bc couldn't talk.  Home exercises haven't been helping.  Th only thing that has really helped in the prednisone.    Review of Systems     Objective:   Physical Exam  Constitutional: She is oriented to person, place, and time. She appears well-developed and well-nourished.  HENT:  Head: Normocephalic and atraumatic.  Musculoskeletal:  She is tender over the right SI joint. There is also moderate muscle near the area that's painful and tender. Negative straight leg raise. Hip strength is 4/5 with flexion. Knee and ankle strength are 5 out of 5 in the right leg.  Neurological: She is alert and oriented to person, place, and time.  Skin: Skin is warm and dry.  Psychiatric: She has a normal mood and affect. Her behavior is normal.          Assessment & Plan:  Right-sided sciatica-we will call the hospital to get a copy of her MRI report. She did respond well to prednisone so we could certainly consider a repeat dosing. MRI of the lumbar spine showed multilevel degenerative disc disease with bilateral foraminal stenosis. This was significant at L2-L3 and L3-L4 and L4-L5. Please see a copy of the report for more specific. Will refer to orthopedics for further evaluation and treatment.  Hypertension-well-controlled on current regimen. Due for CMP and fasting lipid  panel.  Hypertriglyceridemia-it's been there for years since we last looked at her lipids. Recommend repeat those when she is fasting. Lab slip given today.  Fall screening and depression screen performed today.  Is menopausal-due for bone density testing. Last one was in 2008. Order placed for [facility] location.  On review of her lab work done at the hospital her sodium was low at 130. Her liver enzyme, ALT was mildly elevated at 42. Her white blood cells were also elevated at 13,000 with a mild increase in polymorphonuclears and a decrease in lymphocytes. The sites were 7%. Monocytes were 2.6% and eosinophils were low at 0.2% as well. We will recheck a CBC with differential. Will also recheck her potassium and liver enzymes. I suspect she was mildly dehydrated from eating poorly around the time that she was not feeling well.

## 2012-12-08 NOTE — Progress Notes (Signed)
Faxed request to [facility] for results of patients MRI results [phone].[name] [place]

## 2012-12-09 LAB — CBC WITH DIFFERENTIAL/PLATELET
Basophils Absolute: 0 10*3/uL (ref 0.0–0.1)
Eosinophils Absolute: 0.1 10*3/uL (ref 0.0–0.7)
Eosinophils Relative: 2 % (ref 0–5)
MCH: 29.6 pg (ref 26.0–34.0)
MCV: 89 fL (ref 78.0–100.0)
Platelets: 219 10*3/uL (ref 150–400)
RDW: 15.5 % (ref 11.5–15.5)
WBC: 7.2 10*3/uL (ref 4.0–10.5)

## 2013-01-01 ENCOUNTER — Ambulatory Visit (INDEPENDENT_AMBULATORY_CARE_PROVIDER_SITE_OTHER): Payer: Medicare Other | Admitting: Family Medicine

## 2013-01-01 ENCOUNTER — Encounter: Payer: Self-pay | Admitting: Family Medicine

## 2013-01-01 VITALS — BP 112/55 | HR 91

## 2013-01-01 DIAGNOSIS — F4321 Adjustment disorder with depressed mood: Secondary | ICD-10-CM

## 2013-01-01 DIAGNOSIS — M25569 Pain in unspecified knee: Secondary | ICD-10-CM

## 2013-01-01 DIAGNOSIS — N3281 Overactive bladder: Secondary | ICD-10-CM

## 2013-01-01 DIAGNOSIS — N318 Other neuromuscular dysfunction of bladder: Secondary | ICD-10-CM

## 2013-01-01 DIAGNOSIS — M545 Low back pain: Secondary | ICD-10-CM

## 2013-01-01 DIAGNOSIS — M25561 Pain in right knee: Secondary | ICD-10-CM

## 2013-01-01 DIAGNOSIS — Z23 Encounter for immunization: Secondary | ICD-10-CM

## 2013-01-01 DIAGNOSIS — Z780 Asymptomatic menopausal state: Secondary | ICD-10-CM

## 2013-01-01 MED ORDER — GLUCOSAMINE CHONDROITIN COMPLX PO CAPS: 1.0000 | ORAL_CAPSULE | Freq: Two times a day (BID) | ORAL | Status: DC

## 2013-01-01 MED ORDER — NAPROXEN 500 MG PO TABS: 500.0000 mg | ORAL_TABLET | Freq: Two times a day (BID) | ORAL | Status: DC

## 2013-01-01 NOTE — Progress Notes (Signed)
Subjective:    Patient ID: Deanna Wilson, female    DOB: 05-20-1935, 77 y.o.   MRN: [id number]  HPI Low back and right knee pain - Had a bad exeperience at [facility] [place].  Says the PA she saw seemed really disinterested.  She says overall her pain has actually been a little bit more bearable. She just is worried about taking the piroxicam daily. She says she only takes it once a day and does take it with food and water. She's not had any GI irritation or upset but worries about potential long-term consequences of taking this. She wants to know if glucosamine/chondroitin might be an option. She is also interested in physical therapy at this point.  Stopped the myrbetriq bc of elevated BP. Right now she doesn't want to try anything else.  She has been a little bit down lately thinking about her grandson who passed away. She says she doesn't really want to take any medications. She has thought about maybe doing some counseling through hospice but she knows part of it is just taking time. Review of Systems  BP 112/55  Pulse 91    No Known Allergies  Past Medical History  Diagnosis Date  . Chest pain, atypical   . LBBB (left bundle branch block)   . Anxiety     unspecified  . Shoulder pain   . Benign positional vertigo   . Hemorrhoids, external   . Hypertension   . Depression     major, RCR, mild  . Allergy     rhinitis  . Osteoporosis     generalized, multiple sites  . Hypertriglyceridemia   . Diverticulitis of colon     NOS  . Fatty liver     on CT, G6, P3 A3 (mis)  . History of sarcoidosis   . Lumbar stenosis 6-08    at L4, L MRI, saw Dr Blanche East  . Sarcoidosis   . Pulmonary embolism   . Cancer     Past Surgical History  Procedure Laterality Date  . Mastectomy  1975    bilat- prophylactic  . Bilateral oophorectomy  1982  . Carpel tunnel  1990  . Cholecystectomy  1991  . Abdominal hysterectomy  1980    fibroids  . Pft's      no obstruction  . Breast surgery   1975    saline implants  . Partial colectomy  1989    for diverticulitis    History   Social History  . Marital Status: Widowed    Spouse Name: N/A    Number of Children: N/A  . Years of Education: N/A   Occupational History  . Not on file.   Social History Main Topics  . Smoking status: Never Smoker   . Smokeless tobacco: Not on file  . Alcohol Use: No  . Drug Use: No  . Sexual Activity:    Other Topics Concern  . Not on file   Social History Narrative  . No narrative on file    Family History  Problem Relation Age of Onset  . Cancer Mother     Breast  . Cancer Sister     breast  . Cancer Sister     breast  . Cancer Sister     breast  . Cancer Sister     breast  . Cancer Other     breast/breast/breast  . Aplastic anemia Grandchild 10    Outpatient Encounter Prescriptions as of 01/01/2013  Medication Sig  Dispense Refill  . Calcium Carbonate-Vitamin D (CALCIUM + D PO) Take 1 tablet by mouth 2 (two) times daily.        Marland Kitchen escitalopram (LEXAPRO) 20 MG tablet       . gabapentin (NEURONTIN) 100 MG capsule Take 1-2 capsules (100-200 mg total) by mouth at bedtime.  60 capsule  0  . Glucosamine-Chondroit-Vit C-Mn (GLUCOSAMINE CHONDROITIN COMPLX) CAPS Take 1 capsule by mouth 2 (two) times daily.  60 capsule  0  . lansoprazole (PREVACID) 30 MG capsule TAKE (1) CAPSULE DAILY.  30 capsule  3  . lisinopril-hydrochlorothiazide (PRINZIDE,ZESTORETIC) 20-12.5 MG per tablet Take 1 tablet by mouth daily.  30 tablet  1  . multivitamin (THERAGRAN) tablet Take 1 tablet by mouth daily.        . naproxen (NAPROSYN) 500 MG tablet Take 1 tablet (500 mg total) by mouth 2 (two) times daily with a meal.  60 tablet  6  . piroxicam (FELDENE) 10 MG capsule TAKE  (1)  CAPSULE  TWICE DAILY.  60 capsule  1   No facility-administered encounter medications on file as of 01/01/2013.          Objective:   Physical Exam  Constitutional: She is oriented to person, place, and time. She  appears well-developed and well-nourished.  HENT:  Head: Normocephalic and atraumatic.  Eyes: Conjunctivae and EOM are normal.  Cardiovascular: Normal rate.   Pulmonary/Chest: Effort normal.  Neurological: She is alert and oriented to person, place, and time.  Skin: Skin is dry. No pallor.  Psychiatric: She has a normal mood and affect. Her behavior is normal.          Assessment & Plan:  Low back and right knee pain - will refer for physical therapy. I think this would be great place to start. If she's not improving her making progress then we can consider referring to a different orthopedic group for further evaluation. She fell at the very focused on her knee when really her back seems to be the major issue. Also will change the piroxicam 2 naproxen. There is less potential for cardiac side effects with naproxen. She can still use the piroxicam occasionally as it does work very well for her. She's not to mix the 2. Also think starting colchicine and controlling could be very helpful it is worth at least a 4-6 week trial.  Overactive Bladder - we'll discontinue the Myrbetriq because of elevated blood pressure. She opted not to try another medication at this time but will let me know if she's interested in trying something later on.  Acute grieving-I did strongly encourage her to consider the grieving counseling through hospice. His been helpful for several of my patient and I think could be really beneficial. Certainly if at some point she feels that she would benefit from medication I will be happy to discuss that with her further.

## 2013-01-10 ENCOUNTER — Ambulatory Visit (INDEPENDENT_AMBULATORY_CARE_PROVIDER_SITE_OTHER): Payer: Medicare Other | Admitting: Physical Therapy

## 2013-01-10 DIAGNOSIS — M6281 Muscle weakness (generalized): Secondary | ICD-10-CM

## 2013-01-10 DIAGNOSIS — M25569 Pain in unspecified knee: Secondary | ICD-10-CM

## 2013-01-10 DIAGNOSIS — M2550 Pain in unspecified joint: Secondary | ICD-10-CM

## 2013-01-10 DIAGNOSIS — M545 Low back pain: Secondary | ICD-10-CM

## 2013-01-10 DIAGNOSIS — R269 Unspecified abnormalities of gait and mobility: Secondary | ICD-10-CM

## 2013-01-11 ENCOUNTER — Other Ambulatory Visit: Payer: Self-pay | Admitting: Family Medicine

## 2013-01-17 ENCOUNTER — Encounter (INDEPENDENT_AMBULATORY_CARE_PROVIDER_SITE_OTHER): Payer: Medicare Other | Admitting: Physical Therapy

## 2013-01-17 DIAGNOSIS — R269 Unspecified abnormalities of gait and mobility: Secondary | ICD-10-CM

## 2013-01-17 DIAGNOSIS — M6281 Muscle weakness (generalized): Secondary | ICD-10-CM

## 2013-01-17 DIAGNOSIS — M545 Low back pain, unspecified: Secondary | ICD-10-CM

## 2013-01-17 DIAGNOSIS — M2550 Pain in unspecified joint: Secondary | ICD-10-CM

## 2013-01-17 DIAGNOSIS — M25569 Pain in unspecified knee: Secondary | ICD-10-CM

## 2013-01-23 ENCOUNTER — Telehealth: Payer: Self-pay | Admitting: *Deleted

## 2013-01-23 DIAGNOSIS — M25561 Pain in right knee: Secondary | ICD-10-CM

## 2013-01-23 NOTE — Telephone Encounter (Signed)
Ok for referral. Would you mind placing order? Thank you.

## 2013-01-23 NOTE — Telephone Encounter (Signed)
Pt is asking if you can refer her back to [facility] for her knee pain. She states she wants to see a doctor and not the PA. Please advise.  Meyer Cory, LPN

## 2013-01-23 NOTE — Telephone Encounter (Signed)
Referral placed.  Meyer Cory, LPN

## 2013-01-24 ENCOUNTER — Encounter: Payer: Medicare Other | Admitting: Physical Therapy

## 2013-01-30 ENCOUNTER — Ambulatory Visit (INDEPENDENT_AMBULATORY_CARE_PROVIDER_SITE_OTHER): Payer: Medicare Other

## 2013-01-30 DIAGNOSIS — Z780 Asymptomatic menopausal state: Secondary | ICD-10-CM

## 2013-01-30 DIAGNOSIS — M899 Disorder of bone, unspecified: Secondary | ICD-10-CM

## 2013-01-30 LAB — COMPLETE METABOLIC PANEL WITH GFR
AST: 10 U/L (ref 0–37)
Albumin: 4.1 g/dL (ref 3.5–5.2)
BUN: 25 mg/dL — ABNORMAL HIGH (ref 6–23)
Calcium: 9.5 mg/dL (ref 8.4–10.5)
Chloride: 102 mEq/L (ref 96–112)
Potassium: 4.5 mEq/L (ref 3.5–5.3)

## 2013-01-30 LAB — LIPID PANEL
Cholesterol: 257 mg/dL — ABNORMAL HIGH (ref 0–200)
VLDL: 42 mg/dL — ABNORMAL HIGH (ref 0–40)

## 2013-02-05 ENCOUNTER — Telehealth: Payer: Self-pay | Admitting: Family Medicine

## 2013-02-05 NOTE — Telephone Encounter (Signed)
Patient states she has not heard from [place] since she has had bone denisty test last Tuesday

## 2013-02-05 NOTE — Telephone Encounter (Signed)
Called pt and informed that results have not crossed over. Once they are sent we will call. Pt voiced understanding and agreed.[name] [place]

## 2013-02-05 NOTE — Telephone Encounter (Signed)
Patient states she has not heard from [place] since she had bone density last Tuesday.

## 2013-03-06 ENCOUNTER — Ambulatory Visit (INDEPENDENT_AMBULATORY_CARE_PROVIDER_SITE_OTHER): Payer: Medicare Other | Admitting: Family Medicine

## 2013-03-06 ENCOUNTER — Encounter: Payer: Self-pay | Admitting: Family Medicine

## 2013-03-06 VITALS — BP 115/63 | HR 103 | Wt 134.0 lb

## 2013-03-06 DIAGNOSIS — R002 Palpitations: Secondary | ICD-10-CM

## 2013-03-06 DIAGNOSIS — Z01818 Encounter for other preprocedural examination: Secondary | ICD-10-CM

## 2013-03-06 DIAGNOSIS — R011 Cardiac murmur, unspecified: Secondary | ICD-10-CM

## 2013-03-06 DIAGNOSIS — I447 Left bundle-branch block, unspecified: Secondary | ICD-10-CM

## 2013-03-06 MED ORDER — AMITRIPTYLINE HCL 50 MG PO TABS: 25.0000 mg | ORAL_TABLET | Freq: Every day | ORAL | Status: DC

## 2013-03-06 MED ORDER — FERROUS SULFATE CR 160 (50 FE) MG PO TBCR: 160.0000 mg | EXTENDED_RELEASE_TABLET | Freq: Two times a day (BID) | ORAL | Status: DC

## 2013-03-06 NOTE — Progress Notes (Signed)
Subjective:    Patient ID: Deanna Wilson, female    DOB: 06/03/1935, 77 y.o.   MRN: [id number]  HPI Here for preoperative clearance for right hip replacement. She is being followed at [facility]. She has an active history of hypertension, left bundle-branch block, and a history of a pulmonary embolism. She's not on any blood thinners currently. Her pulmonary history she does have sarcoidosis. She has had her dental clearance.  Orthopedic surgeon who recommended that she start an iron supplement approximately 3 weeks prior to surgery. She wants to know what would be best to take over-the-counter. She was also given amitriptyline for short period time and says it worked great to help her sleep well. She would like a prescription for. She's not sure what they're she was given. She has not had any recent chest pain or shortness of breath. No wheezing. She did have an episode of tachycardia about a week or so ago. She says it lasts about 15 minutes. Has not happened again since then and otherwise doing well. She has no known history of coronary artery disease. She does have a history of a benign murmur.  Patient Active Problem List   Diagnosis Date Noted  . Osteoarthritis of right knee 10/09/2012  . Osteoarthritis of hip 09/19/2012  . Overactive bladder 04/13/2012  . Incontinence of urine in female 04/13/2012  . Anal fissure 02/09/2011  . Hemorrhoids, internal, with complication 02/09/2011  . Pulmonary embolism   . NONSPECIFIC ABNORMAL ELECTROCARDIOGRAM 07/17/2010  . LBBB 07/15/2010  . CHEST PAIN, ATYPICAL 07/15/2010  . DEPRESSION 12/06/2009  . ANXIETY STATE, UNSPECIFIED 09/08/2009  . SHOULDER PAIN 05/06/2009  . BENIGN POSITIONAL VERTIGO 03/24/2009  . EXTERNAL HEMORRHOIDS 03/24/2009  . SPINAL STENOSIS, LUMBAR 11/05/2008  . BACK PAIN, UPPER 03/01/2008  . SOB 12/05/2007  . ESSENTIAL HYPERTENSION, BENIGN 03/24/2007  . OSTEOPOROSIS 03/24/2007  . DEPRESSIVE DISORDER, MAJOR, RCR, MILD  04/06/2006  . ALLERGIC RHINITIS 04/06/2006  . OSTEOARTHROSIS, GENERALIZED, MULTIPLE SITES 04/06/2006  . Sarcoidosis 02/15/2006  . HYPERTRIGLYCERIDEMIA 02/15/2006  . DIVERTICULITIS OF COLON, NOS 02/15/2006    Review of Systems     Objective:   Physical Exam  Constitutional: She is oriented to person, place, and time. She appears well-developed and well-nourished.  HENT:  Head: Normocephalic and atraumatic.  Right Ear: External ear normal.  Left Ear: External ear normal.  Nose: Nose normal.  Mouth/Throat: Oropharynx is clear and moist.  TMs and canals are clear.   Eyes: Conjunctivae and EOM are normal. Pupils are equal, round, and reactive to light.  Neck: Neck supple. No thyromegaly present.  Cardiovascular: Normal rate, regular rhythm and normal heart sounds.   Harsh systolid murmur, best heard at the right sternal border. Radiating  To the left carotid  Pulmonary/Chest: Effort normal and breath sounds normal. She has no wheezes.  Abdominal: Soft. Bowel sounds are normal. She exhibits no distension and no mass. There is no tenderness. There is no rebound and no guarding.  Musculoskeletal: She exhibits no edema.  Lymphadenopathy:    She has no cervical adenopathy.  Neurological: She is alert and oriented to person, place, and time.  Skin: Skin is warm and dry.  Psychiatric: She has a normal mood and affect. Her behavior is normal.          Assessment & Plan:  Preoperative surgical clearance for right hip replacement-cleared for surgery.  Blood pressure is well controlled.  She is off all NSAIDs, ASA or blood thinners.  EKG shows rate of 78  beats per minute, left axis deviation., Left bundle branch block. Stable from previous EKG. Please see old EKG for comparison.  Okay to start iron 1-2 times a day. It often causes constipation encouraged her to continue taking this. Then with it if she experiences this. She can also try Slow Fe over-the-counter which tends to cause less  nausea and GI irritation.  I will prescribe amitriptyline to help with sleep and with pain. Start with half a tab and increase up to 2 tabs as needed.  HTN- well controlled.  Blood pressure maximized. Continue current regimen.  Lab Results  Component Value Date   WBC 7.2 12/08/2012   HGB 12.4 12/08/2012   HCT 37.3 12/08/2012   MCV 89.0 12/08/2012   PLT 219 12/08/2012     Chemistry      Component Value Date/Time   NA 137 01/30/2013 0000   K 4.5 01/30/2013 0000   CL 102 01/30/2013 0000   CO2 29 01/30/2013 0000   BUN 25* 01/30/2013 0000   CREATININE 1.12* 01/30/2013 0000   CREATININE 1.26* 07/08/2010 0152      Component Value Date/Time   CALCIUM 9.5 01/30/2013 0000   ALKPHOS 63 01/30/2013 0000   AST 10 01/30/2013 0000   ALT 10 01/30/2013 0000   BILITOT 0.5 01/30/2013 0000

## 2013-03-06 NOTE — Patient Instructions (Signed)
Do not take any NSAIDs or anti-inflammatories prior to surgery. Not taking aspirin prior to surgery.

## 2013-03-07 ENCOUNTER — Encounter: Payer: Self-pay | Admitting: Family Medicine

## 2013-03-07 ENCOUNTER — Telehealth: Payer: Self-pay | Admitting: *Deleted

## 2013-03-07 NOTE — Telephone Encounter (Signed)
PA obtained for echocardiogram. Auth # [id number]. Good from 03/07/13 to 04/05/13.  Meyer Cory, LPN

## 2013-03-12 ENCOUNTER — Telehealth: Payer: Self-pay | Admitting: Family Medicine

## 2013-03-12 DIAGNOSIS — I350 Nonrheumatic aortic (valve) stenosis: Secondary | ICD-10-CM | POA: Insufficient documentation

## 2013-03-12 NOTE — Telephone Encounter (Signed)
Call pt: echo shows mild to moderate aortic stenosis.  This is the cause of her murmur. We will keep an eye on this and rpeat echo in 1 year. Repeat echo in EF is good at 55-60%.  Mild left ventricular hypertrophy.  This is usually from long standing hypertension.

## 2013-03-13 NOTE — Telephone Encounter (Signed)
Pt informed.  Misty Ahmad, LPN  

## 2013-03-21 ENCOUNTER — Telehealth: Payer: Self-pay | Admitting: *Deleted

## 2013-03-21 MED ORDER — NYSTATIN 100000 UNIT/ML MT SUSP: 5.0000 mL | Freq: Four times a day (QID) | OROMUCOSAL | Status: DC

## 2013-03-26 ENCOUNTER — Telehealth: Payer: Self-pay | Admitting: *Deleted

## 2013-03-26 MED ORDER — NYSTATIN 100000 UNIT/ML MT SUSP: 5.0000 mL | Freq: Four times a day (QID) | OROMUCOSAL | Status: DC

## 2013-03-26 NOTE — Telephone Encounter (Signed)
Give her one refill. If not completely resolved needs to be seen.

## 2013-03-26 NOTE — Telephone Encounter (Signed)
Pt.notified

## 2013-03-26 NOTE — Telephone Encounter (Signed)
Pt wanted a refill of nystatin. Is this ok to fill since it was just filled on 11/12? Pt states it did help but not she is developing sores in in her mouth again that appears to be the same thing.

## 2013-03-28 ENCOUNTER — Encounter: Payer: Self-pay | Admitting: Family Medicine

## 2013-04-13 ENCOUNTER — Other Ambulatory Visit: Payer: Self-pay | Admitting: Family Medicine

## 2013-05-15 ENCOUNTER — Other Ambulatory Visit: Payer: Self-pay | Admitting: Family Medicine

## 2013-06-18 ENCOUNTER — Other Ambulatory Visit: Payer: Self-pay | Admitting: Family Medicine

## 2013-07-11 ENCOUNTER — Ambulatory Visit (INDEPENDENT_AMBULATORY_CARE_PROVIDER_SITE_OTHER): Payer: Commercial Managed Care - HMO | Admitting: Physician Assistant

## 2013-07-11 ENCOUNTER — Encounter: Payer: Self-pay | Admitting: Physician Assistant

## 2013-07-11 VITALS — BP 108/56 | HR 85 | Wt 127.0 lb

## 2013-07-11 DIAGNOSIS — Z96641 Presence of right artificial hip joint: Secondary | ICD-10-CM | POA: Insufficient documentation

## 2013-07-11 DIAGNOSIS — I10 Essential (primary) hypertension: Secondary | ICD-10-CM

## 2013-07-11 DIAGNOSIS — F339 Major depressive disorder, recurrent, unspecified: Secondary | ICD-10-CM

## 2013-07-11 DIAGNOSIS — Z96649 Presence of unspecified artificial hip joint: Secondary | ICD-10-CM

## 2013-07-11 DIAGNOSIS — E781 Pure hyperglyceridemia: Secondary | ICD-10-CM

## 2013-07-11 DIAGNOSIS — M1990 Unspecified osteoarthritis, unspecified site: Secondary | ICD-10-CM

## 2013-07-11 DIAGNOSIS — Z79899 Other long term (current) drug therapy: Secondary | ICD-10-CM

## 2013-07-11 MED ORDER — FERROUS SULFATE CR 160 (50 FE) MG PO TBCR: 160.0000 mg | EXTENDED_RELEASE_TABLET | Freq: Two times a day (BID) | ORAL | Status: DC

## 2013-07-11 MED ORDER — LANSOPRAZOLE 30 MG PO CPDR: DELAYED_RELEASE_CAPSULE | ORAL | Status: DC

## 2013-07-11 MED ORDER — CVS FISH OIL 1000 MG PO CAPS: 4.0000 | ORAL_CAPSULE | Freq: Every day | ORAL | Status: DC

## 2013-07-11 MED ORDER — AMBULATORY NON FORMULARY MEDICATION: Status: DC

## 2013-07-11 MED ORDER — GLUCOSAMINE CHONDROITIN COMPLX PO CAPS: 1.0000 | ORAL_CAPSULE | Freq: Two times a day (BID) | ORAL | Status: DC

## 2013-07-11 MED ORDER — ESCITALOPRAM OXALATE 20 MG PO TABS: ORAL_TABLET | ORAL | Status: DC

## 2013-07-11 MED ORDER — CALCIUM CARBONATE-VITAMIN D 250-125 MG-UNIT PO TABS: 2.0000 | ORAL_TABLET | Freq: Two times a day (BID) | ORAL | Status: DC

## 2013-07-11 NOTE — Progress Notes (Signed)
   Subjective:    Patient ID: Deanna Wilson, female    DOB: 01/06/1936, 78 y.o.   MRN: [id number]  HPI Pt is a 78 yo women who presents to the clinic to get medication refills.   Depression- she is not doing as good as she has been. This month is the anniversary of her 20 yo grandson death. She has moments she just does not want to get out of bed. She admits to have cried a lot and having thoughts she would be better off dead. She has not made a plan and reports she would never hurt herself.   HTN- does not check BP at home. She denies any CP, palpiations, SOB, or headaches.   GERD- controlled with prevacid.   She needs handicap stickers and vitamin Rx to get taken off rent.    Review of Systems     Objective:   Physical Exam  Constitutional: She is oriented to person, place, and time. She appears well-developed and well-nourished.  HENT:  Head: Normocephalic and atraumatic.  Cardiovascular: Normal rate.   Murmur heard. Harsh systolic murmur over right sternal border. Dx of aortic stenosis.   Pulmonary/Chest: Effort normal and breath sounds normal. She has no wheezes.  Neurological: She is alert and oriented to person, place, and time.  Skin: Skin is dry.  Psychiatric: She has a normal mood and affect. Her behavior is normal.          Assessment & Plan:  Depression- PHQ-9 was 13. Discussed with patient switching up medications. She did not want to do this. It has been a hard month with anniversary of grandson's death. Discussed counseling with her. She declines at this point. Refilled medication. Follow up in 1 month to make sure improving and not getting worse.   HTN- blood pressure low today. I want her to stop lisinopril and recheck BP at home and in office in one month. Printed lab slip to have bmp done.   GERD- refilled prevacid.   Hypertriglyceridemia-sent rx for fish oil.   Post right hip replacement- printed and filled out handicap form for tag.   Printed  off all vitamins for pt since she can take off her rent this way.

## 2013-09-24 ENCOUNTER — Ambulatory Visit (INDEPENDENT_AMBULATORY_CARE_PROVIDER_SITE_OTHER): Payer: Commercial Managed Care - HMO | Admitting: Family Medicine

## 2013-09-24 ENCOUNTER — Encounter: Payer: Self-pay | Admitting: Family Medicine

## 2013-09-24 VITALS — BP 173/76 | HR 76 | Wt 130.0 lb

## 2013-09-24 DIAGNOSIS — I10 Essential (primary) hypertension: Secondary | ICD-10-CM

## 2013-09-24 DIAGNOSIS — K120 Recurrent oral aphthae: Secondary | ICD-10-CM

## 2013-09-24 MED ORDER — MAGIC MOUTHWASH W/LIDOCAINE: ORAL | Status: DC

## 2013-09-24 MED ORDER — LISINOPRIL 10 MG PO TABS: 10.0000 mg | ORAL_TABLET | Freq: Every day | ORAL | Status: DC

## 2013-09-24 NOTE — Progress Notes (Signed)
   Subjective:    Patient ID: Deanna Wilson, female    DOB: 1935-08-21, 78 y.o.   MRN: [id number]  HPI Had mouth sores on her tongue for 3-4 days.  Has felt a coating on her teeth and some soreness in her jaw. No fever, chills, or sweats. No recent changes in diet or trauma. She's never had ulcers like this on her tongue before that she has had some on her cheeks.  Hypertension- Pt denies chest pain, SOB, dizziness, or heart palpitations.  Taking meds as directed w/o problems.  Denies medication side effects.  lsiinopri was stopped last OV because of lowe rBP.    Review of Systems     Objective:   Physical Exam  Constitutional: She is oriented to person, place, and time. She appears well-developed and well-nourished.  HENT:  Head: Normocephalic and atraumatic.  Right Ear: External ear normal.  Left Ear: External ear normal.  Nose: Nose normal.  Mouth/Throat: Oropharynx is clear and moist.  TMs and canals are clear. Aphthous ulcers on the side of the tongue.   Eyes: Conjunctivae and EOM are normal. Pupils are equal, round, and reactive to light.  Neck: Neck supple. No thyromegaly present.  Cardiovascular: Normal rate, regular rhythm and normal heart sounds.   Pulmonary/Chest: Effort normal and breath sounds normal. She has no wheezes.  Lymphadenopathy:    She has no cervical adenopathy.  Neurological: She is alert and oriented to person, place, and time.  Skin: Skin is warm and dry.  Psychiatric: She has a normal mood and affect.          Assessment & Plan:  Apthous ulcer- gave reassurance. Handout provided. Recommend Magic mouthwash for acute relief. If she starts to have recurrent breakouts then consider testing for Y40, folic acid, iron deficiency as potential underlying cause.  HTN- will restart lisinopril without the hydrochlorothiazide and decrease the lisinopril down to 10 mg daily. Followup in 6-8 weeks for blood pressure.

## 2013-09-24 NOTE — Patient Instructions (Signed)
Canker Sores  Canker sores are painful, open sores on the inside of the mouth and cheek. They may be white or yellow. The sores usually heal in 1 to 2 weeks. Women are more likely than men to have recurrent canker sores. CAUSES The cause of canker sores is not well understood. More than one cause is likely. Canker sores do not appear to be caused by certain types of germs (viruses or bacteria). Canker sores may be caused by:  An allergic reaction to certain foods.  Digestive problems.  Not having enough vitamin V40, folic acid, and iron.  Female sex hormones. Sores may come only during certain phases of a menstrual cycle. Often, there is improvement during pregnancy.  Genetics. Some people seem to inherit canker sore problems. Emotional stress and injuries to the mouth may trigger outbreaks, but not cause them.  DIAGNOSIS Canker sores are diagnosed by exam.  TREATMENT  Patients who have frequent bouts of canker sores may have cultures taken of the sores, blood tests, or allergy tests. This helps determine if their sores are caused by a poor diet, an allergy, or some other preventable or treatable disease.  Vitamins may prevent recurrences or reduce the severity of canker sores in people with poor nutrition.  Numbing ointments can relieve pain. These are available in drug stores without a prescription.  Anti-inflammatory steroid mouth rinses or gels may be prescribed by your caregiver for severe sores.  Oral steroids may be prescribed if you have severe, recurrent canker sores. These strong medicines can cause many side effects and should be used only under the close direction of a dentist or physician.  Mouth rinses containing the antibiotic medicine may be prescribed. They may lessen symptoms and speed healing. Healing usually happens in about 1 or 2 weeks with or without treatment. Certain antibiotic mouth rinses given to pregnant women and young children can permanently stain teeth.  Talk to your caregiver about your treatment. HOME CARE INSTRUCTIONS   Avoid foods that cause canker sores for you.  Avoid citrus juices, spicy or salty foods, and coffee until the sores are healed.  Use a soft-bristled toothbrush.  Chew your food carefully to avoid biting your cheek.  Apply topical numbing medicine to the sore to help relieve pain.  Apply a thin paste of baking soda and water to the sore to help heal the sore.  Only use mouth rinses or medicines for pain or discomfort as directed by your caregiver. SEEK MEDICAL CARE IF:   Your symptoms are not better in 1 week.  Your sores are still present after 2 weeks.  Your sores are very painful.  You have trouble breathing or swallowing.  Your sores come back frequently. Document Released: 08/21/2010 Document Revised: 08/21/2012 Document Reviewed: 08/21/2010 [facility] Patient Information 2014 [place], [place].

## 2014-01-20 ENCOUNTER — Other Ambulatory Visit: Payer: Self-pay | Admitting: Family Medicine

## 2014-01-22 ENCOUNTER — Ambulatory Visit (INDEPENDENT_AMBULATORY_CARE_PROVIDER_SITE_OTHER): Payer: Commercial Managed Care - HMO | Admitting: Family Medicine

## 2014-01-22 ENCOUNTER — Encounter: Payer: Self-pay | Admitting: Family Medicine

## 2014-01-22 DIAGNOSIS — F430 Acute stress reaction: Secondary | ICD-10-CM

## 2014-01-22 DIAGNOSIS — R29898 Other symptoms and signs involving the musculoskeletal system: Secondary | ICD-10-CM

## 2014-01-22 DIAGNOSIS — N644 Mastodynia: Secondary | ICD-10-CM

## 2014-01-22 MED ORDER — AMBULATORY NON FORMULARY MEDICATION: Status: DC

## 2014-01-22 MED ORDER — ESCITALOPRAM OXALATE 10 MG PO TABS: 10.0000 mg | ORAL_TABLET | Freq: Every day | ORAL | Status: DC

## 2014-01-22 NOTE — Progress Notes (Signed)
   Subjective:    Patient ID: Deanna Wilson, female    DOB: Aug 15, 1935, 78 y.o.   MRN: [id number]  HPI pt reports falling 3x in the past few days and has been seen by her ortho was told that she will need referral to Neuro. Hx of right hip replacement.  Will get a sharp pain in her right thigh that comes and goes.  Getting a spider web sensation on the right foot as well. Seems ot be aggrevated by walking.   She would like to see Dr. Junius Creamer in [place].   Dr. Percell Miller, [place] in [facility]. SHe says her breasts been itching, worse on the left. She has salinbe implants form 10 yr ago and she is concerned.  She would like to see him again. She's worried about possible leakage.  She doesn' feel down or depressed bur has been very stressed and anxious. 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 She's had a lot of problems and complications from this. This resulted in a lot of stressful phone calls. In fact she's going up there later this week. She will be back next week. She says she thinks she might need to go back on some medication for the next couple months. She previously took Lexapro for depression.  Review of Systems     Objective:   Physical Exam  Constitutional: She is oriented to person, place, and time. She appears well-developed and well-nourished.  HENT:  Head: Normocephalic and atraumatic.  Cardiovascular: Normal rate, regular rhythm and normal heart sounds.   Pulmonary/Chest: Effort normal and breath sounds normal.  Neurological: She is alert and oriented to person, place, and time.  Skin: Skin is warm and dry.  Psychiatric: She has a normal mood and affect. Her behavior is normal.          Assessment & Plan:  Right hip pain pain/right leg weakness-most likely coming from her back.  We will refer to neurology for further evaluation. She is scheduled for an MRI of her lumbar spine on Saturday with her orthopedist. Encouraged her to keep this and will refer to  neruology as well.   Breast itching-recommend referral to her plastic surgeon to have her implants check just to make sure that she doesn't have any leakage.  Generalized anxiety-recommend restart Lexapro. We can always been in a few months if things get better with her situation. F/U in 3 months if stays on the medication.

## 2014-03-06 ENCOUNTER — Other Ambulatory Visit: Payer: Self-pay | Admitting: Physician Assistant

## 2014-03-18 ENCOUNTER — Encounter: Payer: Self-pay | Admitting: Cardiology

## 2014-05-28 ENCOUNTER — Encounter: Payer: Self-pay | Admitting: Family Medicine

## 2014-05-28 ENCOUNTER — Ambulatory Visit (INDEPENDENT_AMBULATORY_CARE_PROVIDER_SITE_OTHER): Payer: Commercial Managed Care - HMO | Admitting: Family Medicine

## 2014-05-28 VITALS — BP 153/83 | HR 90 | Temp 97.5°F | Wt 133.0 lb

## 2014-05-28 DIAGNOSIS — S030XXA Dislocation of jaw, initial encounter: Secondary | ICD-10-CM

## 2014-05-28 DIAGNOSIS — G4733 Obstructive sleep apnea (adult) (pediatric): Secondary | ICD-10-CM

## 2014-05-28 DIAGNOSIS — K088 Other specified disorders of teeth and supporting structures: Secondary | ICD-10-CM

## 2014-05-28 DIAGNOSIS — H61893 Other specified disorders of external ear, bilateral: Secondary | ICD-10-CM

## 2014-05-28 DIAGNOSIS — S0300XA Dislocation of jaw, unspecified side, initial encounter: Secondary | ICD-10-CM

## 2014-05-28 DIAGNOSIS — I10 Essential (primary) hypertension: Secondary | ICD-10-CM

## 2014-05-28 DIAGNOSIS — K0889 Other specified disorders of teeth and supporting structures: Secondary | ICD-10-CM

## 2014-05-28 MED ORDER — DERMA-SMOOTHE/FS BODY 0.01 % EX OIL: TOPICAL_OIL | CUTANEOUS | Status: DC

## 2014-05-28 MED ORDER — LISINOPRIL 20 MG PO TABS: 20.0000 mg | ORAL_TABLET | Freq: Every day | ORAL | Status: DC

## 2014-05-28 MED ORDER — FLUOCINOLONE ACETONIDE 0.01 % OT OIL: 4.0000 [drp] | TOPICAL_OIL | Freq: Every day | OTIC | Status: DC

## 2014-05-28 MED ORDER — FLUOCINOLONE ACETONIDE 0.01 % OT OIL: TOPICAL_OIL | OTIC | Status: DC

## 2014-05-28 MED ORDER — DIAZEPAM 5 MG PO TABS: 5.0000 mg | ORAL_TABLET | Freq: Every evening | ORAL | Status: DC | PRN

## 2014-05-28 NOTE — Progress Notes (Signed)
   Subjective:    Patient ID: Deanna Wilson, female    DOB: Sep 13, 1935, 79 y.o.   MRN: [id number]  HPI  Ear discomfort x 2-3 wks on the left side. She says she feels like it's right at the anterior edge of the ear. She is also noticing some extra noises when she moves her jaw. She also feels like her back molar on the left lower side is a little bit tender. She has not noticed any drainage or fever. She has not had any recent colds etc.. she stated that the L is worse. she reports that they feel dry and itchy. Hypertension-she says her blood pressures have been up recently. She's been under a lot of stress. She is legally financially responsible for her sister who lives in [place]. But unfortunately her son who is autistic has cause a lot of problems. We had actually recently try decreasing her blood pressure medication. Next  Sleep apnea-she's also interested in possibly looking to an oral appliance. She has sleep study quite some time ago and never got the CPAP.  Review of Systems     Objective:   Physical Exam  Constitutional: She is oriented to person, place, and time. She appears well-developed and well-nourished.  HENT:  Head: Normocephalic and atraumatic.  Right Ear: External ear normal.  Left Ear: External ear normal.  Nose: Nose normal.  Mouth/Throat: Oropharynx is clear and moist.    Tender over the TMJ joint on the left. No crepitus or popping on exam.    Eyes: Conjunctivae and EOM are normal. Pupils are equal, round, and reactive to light.  Neck: Neck supple. No thyromegaly present.  Cardiovascular: Normal rate, regular rhythm and normal heart sounds.   Pulmonary/Chest: Effort normal and breath sounds normal. She has no wheezes.  Lymphadenopathy:    She has no cervical adenopathy.  Neurological: She is alert and oriented to person, place, and time.  Skin: Skin is warm and dry.  Psychiatric: She has a normal mood and affect. Her behavior is normal.         Assessment & Plan:   Hypertension-not well controlled. It is elevated today. Increase the Toprol to 20 mg daily. Follow-up in 3 months. Will repeat BMP at that time.  Left jaw pain-this could be coming from her molar which is tender around the gum. There is no significant erythema or drainage. Recommend that she see her dentist probably to have this evaluated. I do think she also has some element of TMJ on that side. Discussed jaw stretches and strategies to avoid excessive chewing. Recommend a trial of Valium in the evenings since the pain does sometimes wake her up at night.  Dry itchy in her ear-will treat with Derma-Smoothe. Call if too expensive.   Sleep apnea-will refer to [facility] and [facility] for evaluation for oral appliance for sleep apnea.

## 2014-06-07 IMAGING — CT CT PELVIS W/O CM
2 of 4 series · 16 of 46 positions shown, 18 images · non-contrast
Comparison: None.

CLINICAL DATA: Mass in the right groin pain.  History of
diverticulitis.  Recent antibiotics.

CT PELVIS WITHOUT CONTRAST
TECHNIQUE: Multidetector CT imaging of the pelvis was performed
following the standard protocol without intravenous contrast.

[Series 2: abd/pelvis 5.0 b31f · axial · 0.74mm/px · z∈[-719,-499]mm · 13 of 49 slices shown, 15 images]
[im 3/49  soft-tissue]
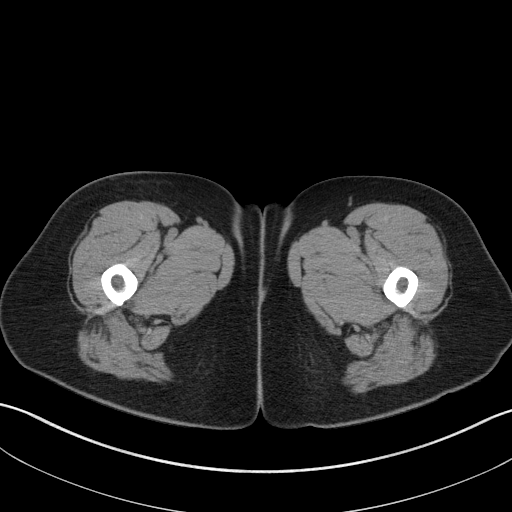
[im 3/49  bone]
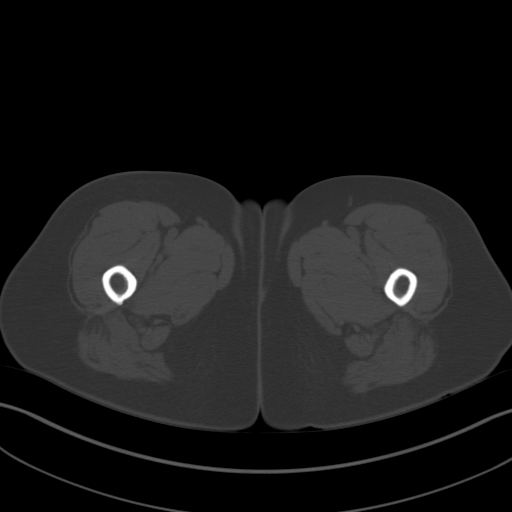
[im 7/49  soft-tissue]
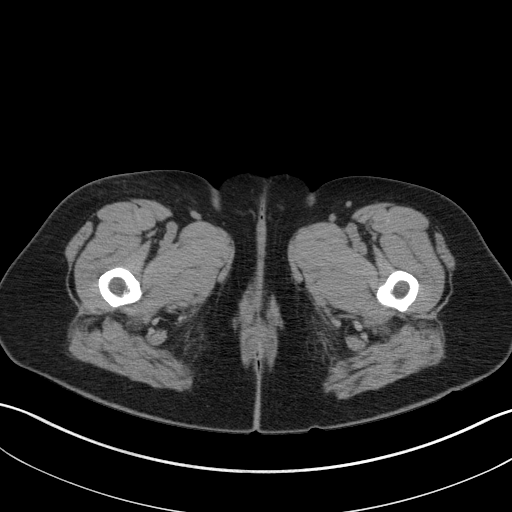
[im 11/49  soft-tissue]
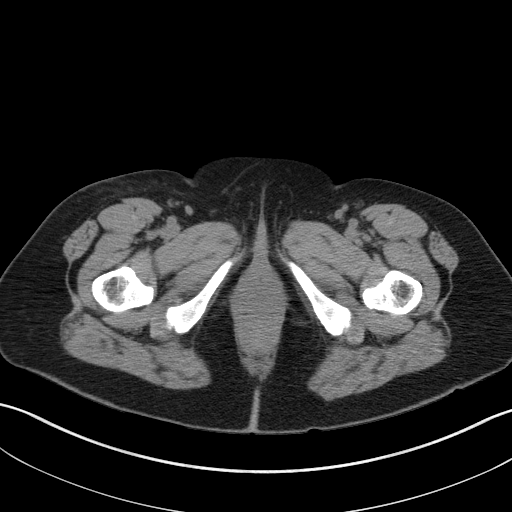
[im 15/49  soft-tissue]
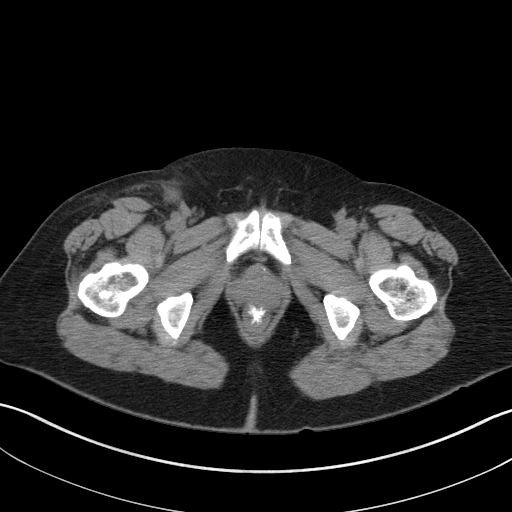
[im 17/49  soft-tissue]
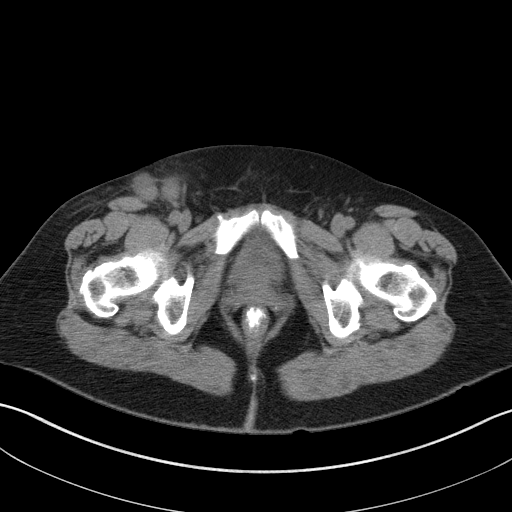
[im 21/49  soft-tissue]
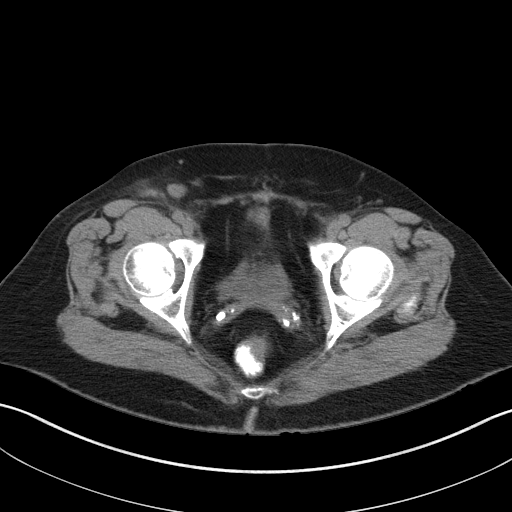
[im 25/49  soft-tissue]
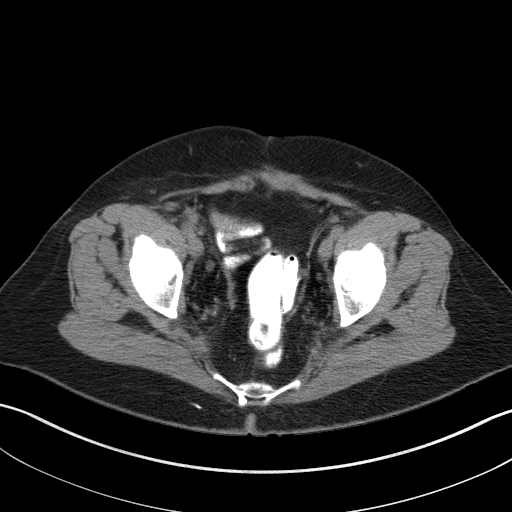
[im 29/49  soft-tissue]
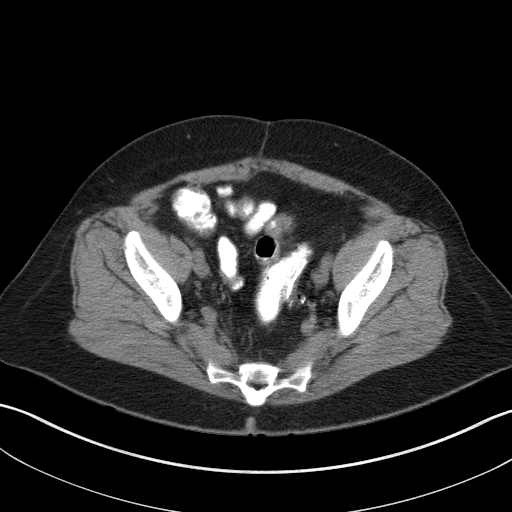
[im 33/49  soft-tissue]
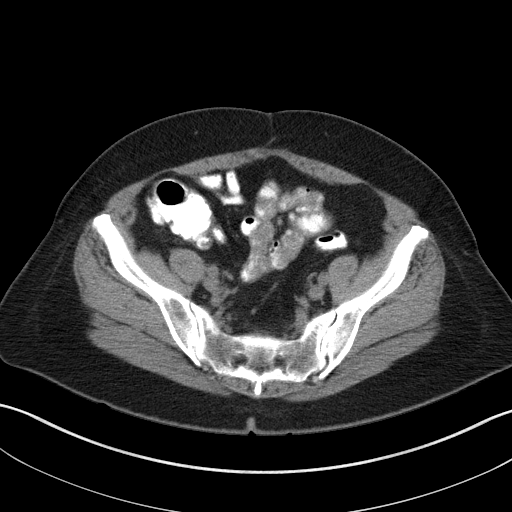
[im 33/49  bone]
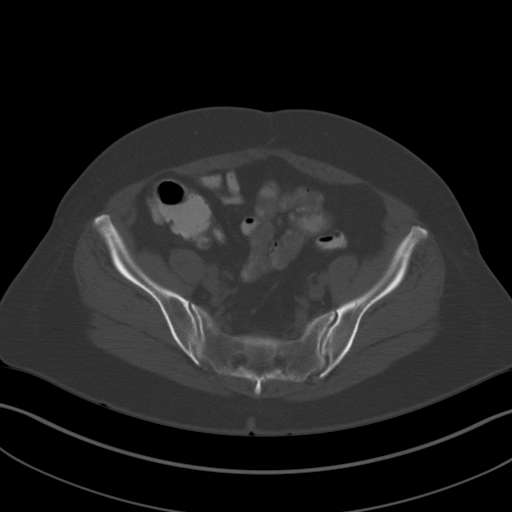
[im 35/49  soft-tissue]
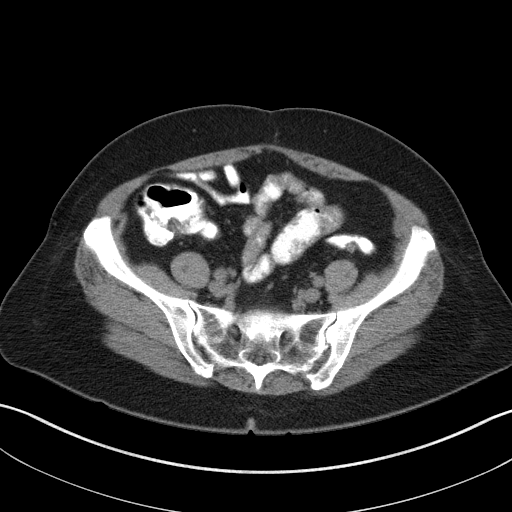
[im 39/49  soft-tissue]
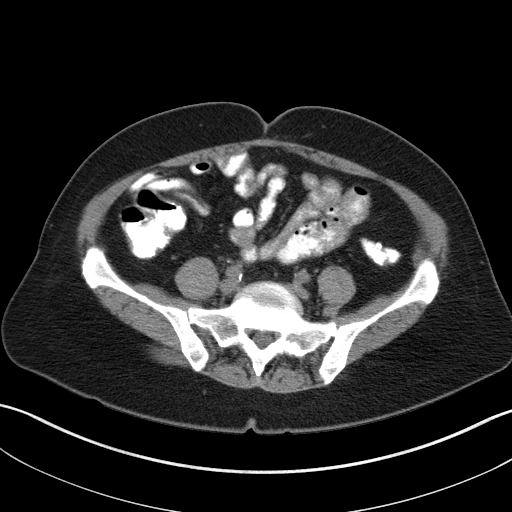
[im 43/49  soft-tissue]
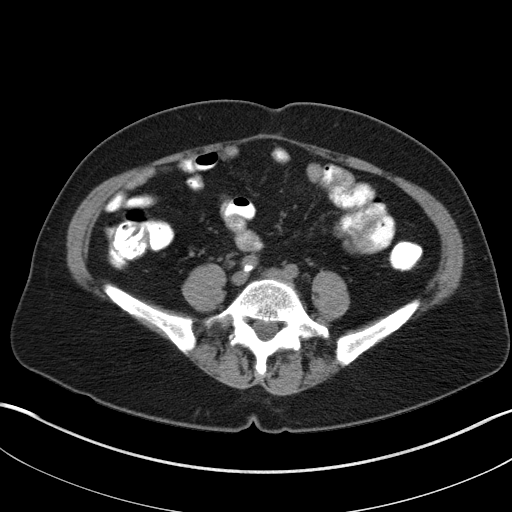
[im 47/49  soft-tissue]
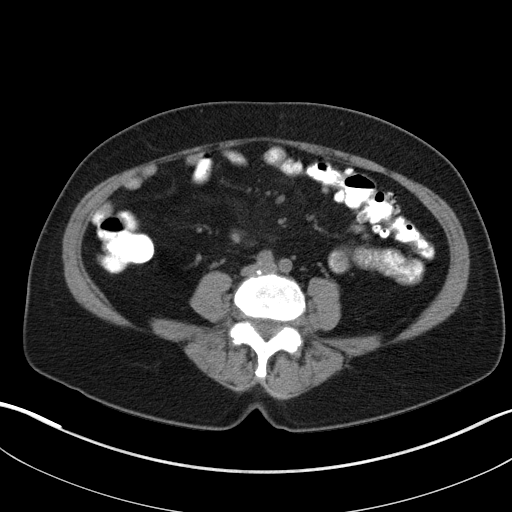

[Series 5: abd/pelvis 3.0 coronal · coronal · 0.51mm/px · 3 of 83 slices shown]
[im 28/83  soft-tissue]
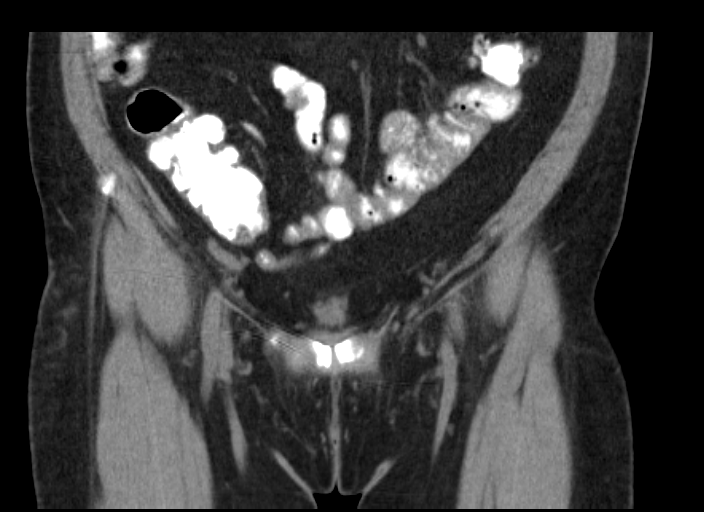
[im 37/83  soft-tissue]
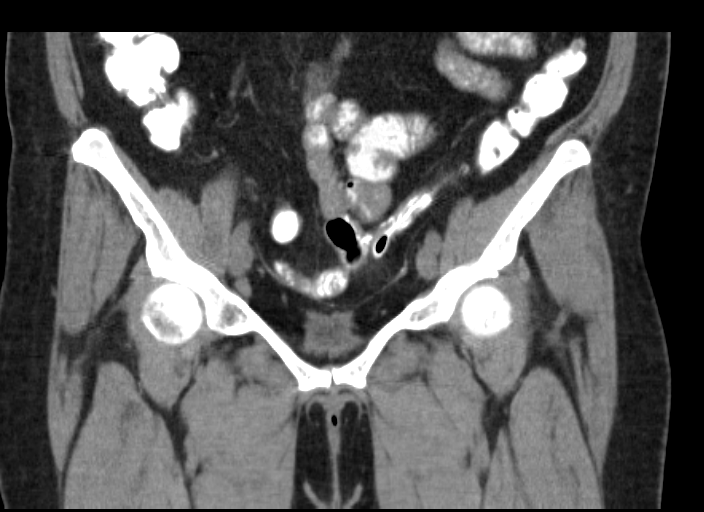
[im 46/83  soft-tissue]
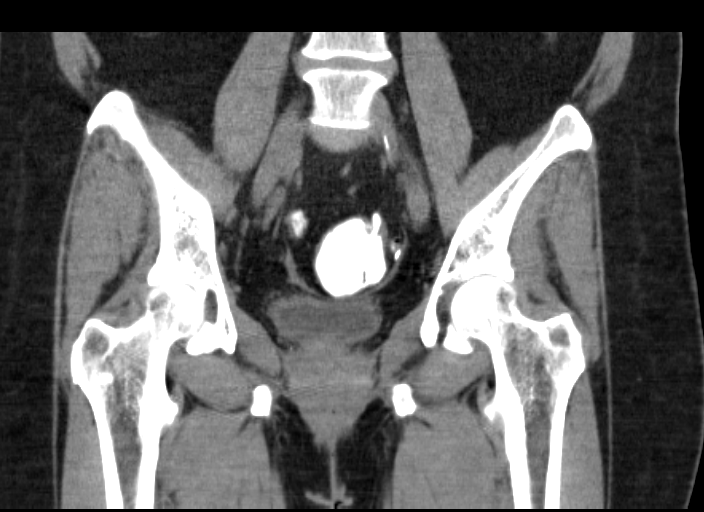

[16 of 46 positions shown; findings below may reference images not displayed]

FINDINGS: Oral contrast is present in the colon and small bowel.
Hysterectomy.  Midline abdominal scar.  Colonic diverticulosis
without diverticulitis.  Abnormal appearance of the sigmoid likely
represents partial sigmoidectomy anastomosis. Urinary bladder is
decompressed.  There is no common or external iliac adenopathy.
Right inguinal adenopathy is present with inflammatory changes
around enlarged lymph nodes.  The largest node measures 25 mm x 20
mm on axial image 32.  There is no left inguinal adenopathy.

Bones demonstrate no aggressive lesions.  Synovial herniation cyst
in the left femoral neck.  Scattered benign bone islands are
present.  Mild sacroiliac joint degenerative disease.  Lower lumbar
facet arthrosis.
IMPRESSION: Nonspecific right inguinal adenopathy.  Largest right inguinal node
measures 25 mm x 20 mm.  Although nonspecific, these are most
commonly reactive to a lower extremity infectious/inflammatory
process or associated with hematalogic malignancy.  Biopsy should
be considered.

## 2014-06-09 ENCOUNTER — Other Ambulatory Visit: Payer: Self-pay | Admitting: Family Medicine

## 2014-06-10 NOTE — Telephone Encounter (Signed)
Will need f/u specifically for this med.last f/u was 9/15

## 2014-07-18 ENCOUNTER — Other Ambulatory Visit: Payer: Self-pay | Admitting: Family Medicine

## 2014-09-10 ENCOUNTER — Other Ambulatory Visit: Payer: Self-pay | Admitting: Family Medicine

## 2014-09-10 ENCOUNTER — Other Ambulatory Visit: Payer: Self-pay | Admitting: Physician Assistant

## 2014-10-06 ENCOUNTER — Other Ambulatory Visit: Payer: Self-pay | Admitting: Family Medicine

## 2014-10-26 ENCOUNTER — Other Ambulatory Visit: Payer: Self-pay | Admitting: Family Medicine

## 2014-10-29 ENCOUNTER — Ambulatory Visit: Payer: Commercial Managed Care - HMO | Admitting: Family Medicine

## 2014-11-04 ENCOUNTER — Other Ambulatory Visit: Payer: Self-pay | Admitting: *Deleted

## 2014-11-04 MED ORDER — LISINOPRIL 20 MG PO TABS: 20.0000 mg | ORAL_TABLET | Freq: Every day | ORAL | Status: DC

## 2014-11-06 ENCOUNTER — Other Ambulatory Visit: Payer: Self-pay | Admitting: *Deleted

## 2014-11-06 MED ORDER — ESCITALOPRAM OXALATE 10 MG PO TABS: 10.0000 mg | ORAL_TABLET | Freq: Every day | ORAL | Status: DC

## 2014-11-16 ENCOUNTER — Other Ambulatory Visit: Payer: Self-pay | Admitting: Family Medicine

## 2014-11-21 ENCOUNTER — Ambulatory Visit (INDEPENDENT_AMBULATORY_CARE_PROVIDER_SITE_OTHER): Payer: Commercial Managed Care - HMO | Admitting: Family Medicine

## 2014-11-21 ENCOUNTER — Encounter: Payer: Self-pay | Admitting: Family Medicine

## 2014-11-21 VITALS — BP 136/61 | HR 88 | Ht 63.0 in | Wt 137.0 lb

## 2014-11-21 DIAGNOSIS — K219 Gastro-esophageal reflux disease without esophagitis: Secondary | ICD-10-CM

## 2014-11-21 DIAGNOSIS — I10 Essential (primary) hypertension: Secondary | ICD-10-CM | POA: Diagnosis not present

## 2014-11-21 DIAGNOSIS — Z9989 Dependence on other enabling machines and devices: Secondary | ICD-10-CM

## 2014-11-21 DIAGNOSIS — F330 Major depressive disorder, recurrent, mild: Secondary | ICD-10-CM | POA: Diagnosis not present

## 2014-11-21 DIAGNOSIS — G4733 Obstructive sleep apnea (adult) (pediatric): Secondary | ICD-10-CM

## 2014-11-21 DIAGNOSIS — E781 Pure hyperglyceridemia: Secondary | ICD-10-CM

## 2014-11-21 DIAGNOSIS — H9319 Tinnitus, unspecified ear: Secondary | ICD-10-CM

## 2014-11-21 DIAGNOSIS — H9202 Otalgia, left ear: Secondary | ICD-10-CM

## 2014-11-21 MED ORDER — LISINOPRIL 20 MG PO TABS: 20.0000 mg | ORAL_TABLET | Freq: Every day | ORAL | Status: AC

## 2014-11-21 MED ORDER — DILTIAZEM GEL 2 %: 1.0000 "application " | Freq: Two times a day (BID) | CUTANEOUS | Status: DC

## 2014-11-21 MED ORDER — FAMOTIDINE 40 MG PO TABS: 40.0000 mg | ORAL_TABLET | Freq: Two times a day (BID) | ORAL | Status: DC

## 2014-11-21 NOTE — Progress Notes (Signed)
   Subjective:    Patient ID: Deanna Wilson, female    DOB: [birth year], 79 y.o.   MRN: [id number]  HPI Hypertension- Pt denies chest pain, SOB, dizziness, or heart palpitations.  Taking meds as directed w/o problems.  Denies medication side effects.  She's currently on lisinopril 20 mg daily.  F/U depression - she is currently taking Lexapro 10 mg daily and is asking for refills today.  Hyperlipidemia-currently only taking Fishel tabs. Last lipid panel was in 2014.  Stil having some pain in the back of her left ear and still some dizziness. We had seen her for similar sxs in Jan.  Also has tinnitus.  She would like ENT referall.   Hx of bowel resection 20 yr ago.  As soon as she eats she has to have a BM.  Will have spells of diarrhea every 2 weeks and has bloating.  Was dx with a fissure.     Colon cancer screening - she really doesn't want to have a scope but is interested in Cologuard.   GERD - heard some bad things about PPIs in the new and wants to stop it.   OSA - she is now using CPAP.    Review of Systems     Objective:   Physical Exam  Constitutional: She is oriented to person, place, and time. She appears well-developed and well-nourished.  HENT:  Head: Normocephalic and atraumatic.  Cardiovascular: Normal rate, regular rhythm and normal heart sounds.   Pulmonary/Chest: Effort normal and breath sounds normal.  Neurological: She is alert and oriented to person, place, and time.  Skin: Skin is warm and dry.  Psychiatric: She has a normal mood and affect. Her behavior is normal.          Assessment & Plan:  Hypertension-well-controlled. Continue current regimen. Follow-up in 6 months.   Depression-PHQ 9 score of of 3.  Well controlled.  Continue current regimen.  Hyperlipidemia-due to repeat lipid panel.  Ear pain/tinnitus - will refer to ENT.    Objective sleep apnea-not using CPAP and followed by pulmonary. Next  GERD-had a long discussion about  potential long-term risk of use of PPIs. She is going to try stepping down to an H2 blocker. Will switch to Pepcid. Next  Colon cancer screening-discussed options. She would like to get tested throughCologuard. I gave her some additional handouts and information and will fax a form over to the company.  Due for Prevnar 13. Will address again at illness exam next month.

## 2014-11-26 ENCOUNTER — Other Ambulatory Visit: Payer: Self-pay | Admitting: Family Medicine

## 2014-12-25 ENCOUNTER — Telehealth: Payer: Self-pay | Admitting: Family Medicine

## 2014-12-25 NOTE — Telephone Encounter (Addendum)
Call patient: Colon cancer screening is negative. Recommend repeat in 3 years. Also she could try a medicine called IB Guard to see if this would help. Not sure about the cost back and some the prescription to her pharmacy if she's interested.

## 2014-12-30 ENCOUNTER — Ambulatory Visit (INDEPENDENT_AMBULATORY_CARE_PROVIDER_SITE_OTHER): Payer: Commercial Managed Care - HMO | Admitting: Family Medicine

## 2014-12-30 ENCOUNTER — Telehealth: Payer: Self-pay | Admitting: *Deleted

## 2014-12-30 ENCOUNTER — Encounter: Payer: Self-pay | Admitting: Family Medicine

## 2014-12-30 VITALS — BP 125/69 | HR 79 | Ht 63.0 in | Wt 136.0 lb

## 2014-12-30 DIAGNOSIS — R0989 Other specified symptoms and signs involving the circulatory and respiratory systems: Secondary | ICD-10-CM

## 2014-12-30 DIAGNOSIS — Z0000 Encounter for general adult medical examination without abnormal findings: Secondary | ICD-10-CM | POA: Diagnosis not present

## 2014-12-30 DIAGNOSIS — Z23 Encounter for immunization: Secondary | ICD-10-CM | POA: Diagnosis not present

## 2014-12-30 MED ORDER — AMBULATORY NON FORMULARY MEDICATION: Status: AC

## 2014-12-30 NOTE — Telephone Encounter (Signed)
Pt given results at [facility] today.Deanna Wilson

## 2014-12-30 NOTE — Progress Notes (Addendum)
Subjective:    Deanna Wilson is a 79 y.o. female who presents for Medicare Annual/Subsequent preventive examination.  Preventive Screening-Counseling & Management  Tobacco History  Smoking status  . Never Smoker   Smokeless tobacco  . Not on file     Problems Prior to Visit 1.   Current Problems (verified) Patient Active Problem List   Diagnosis Date Noted  . OSA on CPAP 11/21/2014  . History of total right hip replacement 07/11/2013  . Aortic stenosis, moderate 03/12/2013  . Osteoarthritis of right knee 10/09/2012  . Osteoarthritis of hip 09/19/2012  . Overactive bladder 04/13/2012  . Incontinence of urine in female 04/13/2012  . Anal fissure 02/09/2011  . Hemorrhoids, internal, with complication 42/70/6237  . Pulmonary embolism   . NONSPECIFIC ABNORMAL ELECTROCARDIOGRAM 07/17/2010  . LBBB 07/15/2010  . CHEST PAIN, ATYPICAL 07/15/2010  . ANXIETY STATE, UNSPECIFIED 09/08/2009  . SHOULDER PAIN 05/06/2009  . BENIGN POSITIONAL VERTIGO 03/24/2009  . EXTERNAL HEMORRHOIDS 03/24/2009  . SPINAL STENOSIS, LUMBAR 11/05/2008  . BACK PAIN, UPPER 03/01/2008  . SOB 12/05/2007  . ESSENTIAL HYPERTENSION, BENIGN 03/24/2007  . OSTEOPOROSIS 03/24/2007  . DEPRESSIVE DISORDER, MAJOR, RCR, MILD 04/06/2006  . ALLERGIC RHINITIS 04/06/2006  . OSTEOARTHROSIS, GENERALIZED, MULTIPLE SITES 04/06/2006  . Sarcoidosis 02/15/2006  . HYPERTRIGLYCERIDEMIA 02/15/2006  . DIVERTICULITIS OF COLON, NOS 02/15/2006    Medications Prior to Visit Current Outpatient Prescriptions on File Prior to Visit  Medication Sig Dispense Refill  . escitalopram (LEXAPRO) 10 MG tablet Take 1 tablet (10 mg total) by mouth daily. 30 tablet 0  . famotidine (PEPCID) 40 MG tablet Take 1 tablet (40 mg total) by mouth 2 (two) times daily. 60 tablet 11  . lisinopril (PRINIVIL,ZESTRIL) 20 MG tablet Take 1 tablet (20 mg total) by mouth daily. 90 tablet 3  . Omega-3 Fatty Acids ([employer] FISH OIL) 1000 MG CAPS Take 4 tablets by  mouth daily. 90 capsule 11  . [DISCONTINUED] simvastatin (ZOCOR) 20 MG tablet Take 20 mg by mouth at bedtime.       No current facility-administered medications on file prior to visit.    Current Medications (verified) Current Outpatient Prescriptions  Medication Sig Dispense Refill  . escitalopram (LEXAPRO) 10 MG tablet Take 1 tablet (10 mg total) by mouth daily. 30 tablet 0  . famotidine (PEPCID) 40 MG tablet Take 1 tablet (40 mg total) by mouth 2 (two) times daily. 60 tablet 11  . lisinopril (PRINIVIL,ZESTRIL) 20 MG tablet Take 1 tablet (20 mg total) by mouth daily. 90 tablet 3  . Omega-3 Fatty Acids ([employer] FISH OIL) 1000 MG CAPS Take 4 tablets by mouth daily. 90 capsule 11  . [DISCONTINUED] simvastatin (ZOCOR) 20 MG tablet Take 20 mg by mouth at bedtime.       No current facility-administered medications for this visit.     Allergies (verified) Review of patient's allergies indicates no known allergies.   PAST HISTORY  Family History Family History  Problem Relation Age of Onset  . Cancer Mother     Breast  . Cancer Sister     breast  . Cancer Sister     breast  . Cancer Sister     breast  . Cancer Sister     breast  . Cancer Other     breast/breast/breast  . Aplastic anemia Grandchild 10    Social History Social History  Substance Use Topics  . Smoking status: Never Smoker   . Smokeless tobacco: Not on file  . Alcohol Use:  No     Are there smokers in your home (other than you)? No  Risk Factors Current exercise habits: walks some  Dietary issues discussed: None   Cardiac risk factors: diabetes mellitus and hypertension.  Depression Screen (Note: if answer to either of the following is "Yes", a more complete depression screening is indicated)   Over the past two weeks, have you felt down, depressed or hopeless? No  Over the past two weeks, have you felt little interest or pleasure in doing things? No  Have you lost interest or pleasure in daily life?  No  Do you often feel hopeless? No  Do you cry easily over simple problems? No  Activities of Daily Living In your present state of health, do you have any difficulty performing the following activities?:  Driving? No Managing money?  No Feeding yourself? No Getting from bed to chair? No  Climbing a flight of stairs? No Preparing food and eating?: No Bathing or showering? No Getting dressed: No Getting to the toilet? No Using the toilet:No Moving around from place to place: No In the past year have you fallen or had a near fall?:No   Are you sexually active?  No  Do you have more than one partner?  No  Hearing Difficulties: No Do you often ask people to speak up or repeat themselves? No Do you experience ringing or noises in your ears? Yes Do you have difficulty understanding soft or whispered voices? No   Do you feel that you have a problem with memory? No  Do you often misplace items? No  Do you feel safe at home?  Yes  Cognitive Testing  Alert? Yes  Normal Appearance?Yes  Oriented to person? Yes  Place? Yes   Time? Yes  Recall of three objects?  Yes  Can perform simple calculations? Yes  Displays appropriate judgment?Yes  Can read the correct time from a watch face?Yes   6 CIT score of 0/28 ( normal)    Advanced Directives have been discussed with the patient? No  List the Names of Other Physician/Practitioners you currently use: 1.    Indicate any recent Medical Services you may have received from other than [facility] providers in the past year (date may be approximate).  Immunization History  Administered Date(s) Administered  . Influenza Split 04/13/2012  . Influenza Whole 02/11/2006, 02/10/2009  . Influenza,inj,Quad PF,36+ Mos 01/01/2013  . Pneumococcal Polysaccharide-23 03/09/2005  . Td 05/10/2002, 09/23/2008  . Zoster 02/08/2012    Screening Tests Health Maintenance  Topic Date Due  . PNA vac Low Risk Adult (2 of 2 - PCV13) 03/09/2006  . COLONOSCOPY   07/09/2014  . INFLUENZA VACCINE  03/10/2015 (Originally 12/09/2014)  . TETANUS/TDAP  09/24/2018  . DEXA SCAN  Completed  . ZOSTAVAX  Completed    All answers were reviewed with the patient and necessary referrals were made:  Jatavian Calica, MD   12/30/2014   History reviewed: allergies, current medications, past family history, past medical history, past social history, past surgical history and problem list  Review of Systems A comprehensive review of systems was negative.    Objective:     Vision by Snellen chart: eye exam is UTD.   Body mass index is 24.1 kg/(m^2). BP 125/69 mmHg  Pulse 79  Ht 5\' 3"  (1.6 m)  Wt 136 lb (61.689 kg)  BMI 24.10 kg/m2  BP 125/69 mmHg  Pulse 79  Ht 5\' 3"  (1.6 m)  Wt 136 lb (61.689 kg)  BMI  24.10 kg/m2 General appearance: alert, cooperative and appears stated age Head: Normocephalic, without obvious abnormality, atraumatic Eyes: conj clear EOMI, PEERLA Ears: normal TM's and external ear canals both ears Nose: Nares normal. Septum midline. Mucosa normal. No drainage or sinus tenderness. Throat: lips, mucosa, and tongue normal; teeth and gums normal Neck: no adenopathy, no carotid bruit, no JVD, supple, symmetrical, trachea midline and thyroid not enlarged, symmetric, no tenderness/mass/nodules Back: symmetric, no curvature. ROM normal. No CVA tenderness. Lungs: clear to auscultation bilaterally Heart: RRR, with 2/6 SEM best heard at the left sternal border. . Left carotid bruit.  Abdomen: soft, non-tender; bowel sounds normal; no masses,  no organomegaly Extremities: extremities normal, atraumatic, no cyanosis or edema Pulses: 2+ and symmetric Skin: Skin color, texture, turgor normal. No rashes or lesions Lymph nodes: Cervical, supraclavicular, and axillary nodes normal. Neurologic: Alert and oriented X 3, normal strength and tone. Normal symmetric reflexes. Normal coordination and gait     Assessment:     Medicare Annual Wellness        Plan:     During the course of the visit the patient was educated and counseled about appropriate screening and preventive services including:    Pneumococcal vaccine   Influenza vaccine  - declined.   IBS - try IBGard. Can be take PRN for IBS pain sxs.  Written as Rx.   Says the diltiazem never made it to the pharmacy . Will call and see why not processed.   Left carotid bruit - will schedule for carotid duplex.   Diet review for nutrition referral? Yes ____  Not Indicated _X__   Patient Instructions (the written plan) was given to the patient.  Medicare Attestation I have personally reviewed: The patient's medical and social history Their use of alcohol, tobacco or illicit drugs Their current medications and supplements The patient's functional ability including ADLs,fall risks, home safety risks, cognitive, and hearing and visual impairment Diet and physical activities Evidence for depression or mood disorders  The patient's weight, height, BMI, and visual acuity have been recorded in the chart.  I have made referrals, counseling, and provided education to the patient based on review of the above and I have provided the patient with a written personalized care plan for preventive services.     Alucard Fearnow, MD   12/30/2014

## 2014-12-30 NOTE — Telephone Encounter (Signed)
Spoke w/paul he stated that they don't have it. He will call the warehouse to see if he can get this for her.Deanna Wilson [place]

## 2014-12-31 ENCOUNTER — Other Ambulatory Visit: Payer: Self-pay | Admitting: *Deleted

## 2014-12-31 ENCOUNTER — Other Ambulatory Visit: Payer: Self-pay | Admitting: Family Medicine

## 2014-12-31 DIAGNOSIS — E785 Hyperlipidemia, unspecified: Secondary | ICD-10-CM

## 2014-12-31 LAB — COMPLETE METABOLIC PANEL WITH GFR
ALK PHOS: 75 U/L (ref 33–130)
ALT: 18 U/L (ref 6–29)
AST: 18 U/L (ref 10–35)
Albumin: 4.2 g/dL (ref 3.6–5.1)
BUN: 19 mg/dL (ref 7–25)
CO2: 23 mmol/L (ref 20–31)
Calcium: 9 mg/dL (ref 8.6–10.4)
Chloride: 103 mmol/L (ref 98–110)
Creat: 1.16 mg/dL — ABNORMAL HIGH (ref 0.60–0.93)
GFR, EST NON AFRICAN AMERICAN: 46 mL/min — AB (ref >=60)
GFR, Est African American: 52 mL/min — ABNORMAL LOW (ref >=60)
Glucose, Bld: 108 mg/dL — ABNORMAL HIGH (ref 65–99)
POTASSIUM: 4.3 mmol/L (ref 3.5–5.3)
SODIUM: 140 mmol/L (ref 135–146)
Total Bilirubin: 0.4 mg/dL (ref 0.2–1.2)
Total Protein: 6.4 g/dL (ref 6.1–8.1)

## 2014-12-31 LAB — LIPID PANEL
Cholesterol: 236 mg/dL — ABNORMAL HIGH (ref 125–200)
HDL: 41 mg/dL — ABNORMAL LOW (ref >=46)
LDL Cholesterol: 162 mg/dL — ABNORMAL HIGH (ref <130)
Total CHOL/HDL Ratio: 5.8 Ratio — ABNORMAL HIGH (ref <=5.0)
Triglycerides: 165 mg/dL — ABNORMAL HIGH (ref <150)
VLDL: 33 mg/dL — ABNORMAL HIGH (ref <30)

## 2014-12-31 MED ORDER — ATORVASTATIN CALCIUM 20 MG PO TABS: 20.0000 mg | ORAL_TABLET | Freq: Every day | ORAL | Status: DC

## 2015-01-02 ENCOUNTER — Other Ambulatory Visit: Payer: Self-pay | Admitting: Family Medicine

## 2015-01-02 ENCOUNTER — Telehealth: Payer: Self-pay | Admitting: Family Medicine

## 2015-01-02 DIAGNOSIS — I350 Nonrheumatic aortic (valve) stenosis: Secondary | ICD-10-CM

## 2015-01-02 DIAGNOSIS — R0989 Other specified symptoms and signs involving the circulatory and respiratory systems: Secondary | ICD-10-CM

## 2015-01-02 NOTE — Telephone Encounter (Signed)
Please call patient and let her know that her last echocardiogram was in October 2014. So it will have been 2 years this October. Because of the hardening of the aortic valve I would like to recheck this. I am going to go ahead and place in order to get this scheduled. She had this done at [facility] cardiology in [place] previously. Please see if she would like to have it done there again.

## 2015-01-02 NOTE — Progress Notes (Signed)
Had to change order based on imaging requirement. Test remains the same.

## 2015-01-03 NOTE — Telephone Encounter (Signed)
Patient would like to go back to [facility].

## 2015-01-03 NOTE — Telephone Encounter (Signed)
Order printed

## 2015-01-06 ENCOUNTER — Other Ambulatory Visit: Payer: Self-pay | Admitting: Family Medicine

## 2015-01-06 ENCOUNTER — Ambulatory Visit (HOSPITAL_COMMUNITY)
Admission: RE | Admit: 2015-01-06 | Discharge: 2015-01-06 | Disposition: A | Discharge status: Home / Self Care | Payer: Commercial Managed Care - HMO | Source: Ambulatory Visit | Attending: Cardiovascular Disease | Admitting: Cardiovascular Disease

## 2015-01-06 DIAGNOSIS — I6523 Occlusion and stenosis of bilateral carotid arteries: Secondary | ICD-10-CM | POA: Insufficient documentation

## 2015-01-06 DIAGNOSIS — R0989 Other specified symptoms and signs involving the circulatory and respiratory systems: Secondary | ICD-10-CM | POA: Diagnosis not present

## 2015-01-06 MED ORDER — ATORVASTATIN CALCIUM 40 MG PO TABS: 40.0000 mg | ORAL_TABLET | Freq: Every day | ORAL | Status: DC

## 2015-01-09 ENCOUNTER — Other Ambulatory Visit: Payer: Self-pay | Admitting: Family Medicine

## 2015-01-27 ENCOUNTER — Ambulatory Visit (INDEPENDENT_AMBULATORY_CARE_PROVIDER_SITE_OTHER): Payer: Commercial Managed Care - HMO | Admitting: Family Medicine

## 2015-01-27 ENCOUNTER — Encounter: Payer: Self-pay | Admitting: Family Medicine

## 2015-01-27 VITALS — BP 126/62 | HR 68 | Wt 136.0 lb

## 2015-01-27 DIAGNOSIS — M48061 Spinal stenosis, lumbar region without neurogenic claudication: Secondary | ICD-10-CM

## 2015-01-27 DIAGNOSIS — M4806 Spinal stenosis, lumbar region: Secondary | ICD-10-CM

## 2015-01-27 DIAGNOSIS — I350 Nonrheumatic aortic (valve) stenosis: Secondary | ICD-10-CM | POA: Diagnosis not present

## 2015-01-27 DIAGNOSIS — K141 Geographic tongue: Secondary | ICD-10-CM | POA: Diagnosis not present

## 2015-01-27 MED ORDER — OXYCODONE-ACETAMINOPHEN 5-325 MG PO TABS: 1.0000 | ORAL_TABLET | Freq: Three times a day (TID) | ORAL | Status: DC | PRN

## 2015-01-27 MED ORDER — NYSTATIN 100000 UNIT/ML MT SUSP: 500000.0000 [IU] | Freq: Four times a day (QID) | OROMUCOSAL | Status: DC

## 2015-01-27 NOTE — Assessment & Plan Note (Signed)
Murmur noted today likely due to aortic stenosis. Follow-up with PCP.

## 2015-01-27 NOTE — Patient Instructions (Signed)
Thank you for coming in today. Take oxycodone as needed for severe back pain. Use a heating pad as well. I recommend physical therapy if no better. Return in 2-3 weeks. Use nystatin swish and swallow if your tongue does not improve.   Lumbosacral Strain Lumbosacral strain is a strain of any of the parts that make up your lumbosacral vertebrae. Your lumbosacral vertebrae are the bones that make up the lower third of your backbone. Your lumbosacral vertebrae are held together by muscles and tough, fibrous tissue (ligaments).  CAUSES  A sudden blow to your back can cause lumbosacral strain. Also, anything that causes an excessive stretch of the muscles in the low back can cause this strain. This is typically seen when people exert themselves strenuously, fall, lift heavy objects, bend, or crouch repeatedly. RISK FACTORS  Physically demanding work.  Participation in pushing or pulling sports or sports that require a sudden twist of the back (tennis, golf, baseball).  Weight lifting.  Excessive lower back curvature.  Forward-tilted pelvis.  Weak back or abdominal muscles or both.  Tight hamstrings. SIGNS AND SYMPTOMS  Lumbosacral strain may cause pain in the area of your injury or pain that moves (radiates) down your leg.  DIAGNOSIS Your health care provider can often diagnose lumbosacral strain through a physical exam. In some cases, you may need tests such as X-ray exams.  TREATMENT  Treatment for your lower back injury depends on many factors that your clinician will have to evaluate. However, most treatment will include the use of anti-inflammatory medicines. HOME CARE INSTRUCTIONS   Avoid hard physical activities (tennis, racquetball, waterskiing) if you are not in proper physical condition for it. This may aggravate or create problems.  If you have a back problem, avoid sports requiring sudden body movements. Swimming and walking are generally safer activities.  Maintain good  posture.  Maintain a healthy weight.  For acute conditions, you may put ice on the injured area.  Put ice in a plastic bag.  Place a towel between your skin and the bag.  Leave the ice on for 20 minutes, 2-3 times a day.  When the low back starts healing, stretching and strengthening exercises may be recommended. SEEK MEDICAL CARE IF:  Your back pain is getting worse.  You experience severe back pain not relieved with medicines. SEEK IMMEDIATE MEDICAL CARE IF:   You have numbness, tingling, weakness, or problems with the use of your arms or legs.  There is a change in bowel or bladder control.  You have increasing pain in any area of the body, including your belly (abdomen).  You notice shortness of breath, dizziness, or feel faint.  You feel sick to your stomach (nauseous), are throwing up (vomiting), or become sweaty.  You notice discoloration of your toes or legs, or your feet get very cold. MAKE SURE YOU:   Understand these instructions.  Will watch your condition.  Will get help right away if you are not doing well or get worse. Document Released: 02/03/2005 Document Revised: 05/01/2013 Document Reviewed: 12/13/2012 [facility] Patient Information 2015 [place], [place]. This information is not intended to replace advice given to you by your health care provider. Make sure you discuss any questions you have with your health care provider.

## 2015-01-27 NOTE — Assessment & Plan Note (Signed)
Back pain likely related to myofascial strain versus spinal stenosis sequelae. Patient is not interested in physical therapy. Plan for home exercises watchful waiting and oxycodone. Return in a few weeks.

## 2015-01-27 NOTE — Progress Notes (Signed)
   Subjective:    I'm seeing this patient as a consultation for: Dr. Madilyn Fireman  CC: Back pain  HPI:  Patient has a history of chronic intermittent lumbar back pain. She has spinal stenosis. She was doing well without any pain until yesterday. She denies any specific injury and is not sure what exactly caused the pain. She has pain is confined to her right low back and does not radiate. No weakness or numbness bowel bladder dysfunction or difficulty walking. She's tried some old leftover oxycodone which seemed to help a lot. She is not interested in physical therapy at this time. No fevers chills nausea vomiting or diarrhea.  Tongue: patient has an abnormal pattern on her tongue present for a few weeks. She denies any pain. She denies any irritation. No fevers chills nausea vomiting or diarrhea. No treatment tried yet.  Past medical history, Surgical history, Family history not pertinant except as noted below, Social history, Allergies, and medications have been entered into the medical record, reviewed, and no changes needed.   Review of Systems: No headache, visual changes, nausea, vomiting, diarrhea, constipation, dizziness, abdominal pain, skin rash, fevers, chills, night sweats, weight loss, swollen lymph nodes, body aches, joint swelling, muscle aches, chest pain, shortness of breath, mood changes, visual or auditory hallucinations.   Objective:    Filed Vitals:   01/27/15 1330  BP: 126/62  Pulse: 68   General: Well Developed, well nourished, and in no acute distress.  Neuro/Psych: Alert and oriented x3, extra-ocular muscles intact, able to move all 4 extremities, sensation grossly intact. Skin: Warm and dry, no rashes noted.  Mouth: Geographic tongue Respiratory: Not using accessory muscles, speaking in full sentences, trachea midline.  Cardiovascular: Pulses palpable, no extremity edema. Whooshing systolic murmur noted 3/6 Abdomen: Does not appear distended. MSK: Nontender to  spinal midline. Tender palpation right SI joint. Lumbar range of motion is limited by pain. Lower extremity strength is equal and normal throughout bilateral lower extremities. Reflexes are diminished but equal bilateral knees and ankles. Sensation is intact throughout. Negative straight leg raise test bilaterally. Positive Faber test right negative left. Antalgic gait.  X-ray lumbar spine dated June 2014 reviewed  No results found for this or any previous visit (from the past 24 hour(s)). No results found.  Impression and Recommendations:   This case required medical decision making of moderate complexity.

## 2015-01-27 NOTE — Assessment & Plan Note (Signed)
Incidentally noted geographic tongue. Nystatin each for use if patient has irritation. Doubtful for yeast.

## 2015-02-15 ENCOUNTER — Other Ambulatory Visit: Payer: Self-pay | Admitting: Family Medicine

## 2015-02-17 ENCOUNTER — Ambulatory Visit: Payer: Commercial Managed Care - HMO | Admitting: Family Medicine

## 2015-03-06 ENCOUNTER — Emergency Department (HOSPITAL_BASED_OUTPATIENT_CLINIC_OR_DEPARTMENT_OTHER): Payer: 59

## 2015-03-06 ENCOUNTER — Observation Stay (HOSPITAL_BASED_OUTPATIENT_CLINIC_OR_DEPARTMENT_OTHER)
Admission: EM | Admit: 2015-03-06 | Discharge: 2015-03-08 | Disposition: A | Discharge status: Home / Self Care | Payer: 59 | Attending: Internal Medicine | Admitting: Internal Medicine

## 2015-03-06 ENCOUNTER — Encounter (HOSPITAL_BASED_OUTPATIENT_CLINIC_OR_DEPARTMENT_OTHER): Payer: Self-pay

## 2015-03-06 DIAGNOSIS — Z86711 Personal history of pulmonary embolism: Secondary | ICD-10-CM | POA: Diagnosis present

## 2015-03-06 DIAGNOSIS — R079 Chest pain, unspecified: Secondary | ICD-10-CM | POA: Diagnosis present

## 2015-03-06 DIAGNOSIS — I10 Essential (primary) hypertension: Secondary | ICD-10-CM | POA: Diagnosis present

## 2015-03-06 DIAGNOSIS — R0789 Other chest pain: Secondary | ICD-10-CM | POA: Insufficient documentation

## 2015-03-06 DIAGNOSIS — R0600 Dyspnea, unspecified: Secondary | ICD-10-CM | POA: Diagnosis present

## 2015-03-06 DIAGNOSIS — I447 Left bundle-branch block, unspecified: Secondary | ICD-10-CM | POA: Insufficient documentation

## 2015-03-06 HISTORY — DX: Presence of dental prosthetic device (complete) (partial): Z97.2

## 2015-03-06 HISTORY — DX: Presence of spectacles and contact lenses: Z97.3

## 2015-03-06 HISTORY — DX: Essential (primary) hypertension: I10

## 2015-03-06 LAB — COMPREHENSIVE METABOLIC PROFILE - BMC/JMC ONLY
ALBUMIN/GLOBULIN RATIO: 1.7 (ref 0.8–2.0)
ALBUMIN: 3.9 g/dL (ref 3.5–5.0)
ALKALINE PHOSPHATASE: 83 U/L (ref 38–126)
ALT (SGPT): 18 U/L (ref 14–54)
ANION GAP: 7 mmol/L (ref 3–11)
AST (SGOT): 20 U/L (ref 15–41)
BILIRUBIN TOTAL: 0.2 mg/dL — ABNORMAL LOW (ref 0.3–1.2)
BUN/CREA RATIO: 15 (ref 6–22)
BUN: 20 mg/dL (ref 6–20)
CALCIUM: 9.3 mg/dL (ref 8.8–10.2)
CHLORIDE: 105 mmol/L (ref 101–111)
CHLORIDE: 105 mmol/L (ref 101–111)
CO2 TOTAL: 28 mmol/L (ref 22–32)
CREATININE: 1.34 mg/dL — ABNORMAL HIGH (ref 0.44–1.00)
ESTIMATED GFR: 38 mL/min/1.73mˆ2 — ABNORMAL LOW (ref >60)
GLUCOSE: 128 mg/dL — ABNORMAL HIGH (ref 70–110)
GLUCOSE: 128 mg/dL — ABNORMAL HIGH (ref 70–110)
POTASSIUM: 3.9 mmol/L (ref 3.4–5.1)
PROTEIN TOTAL: 6.2 g/dL — ABNORMAL LOW (ref 6.4–8.3)
PROTEIN TOTAL: 6.2 g/dL — ABNORMAL LOW (ref 6.4–8.3)
SODIUM: 140 mmol/L (ref 136–145)

## 2015-03-06 LAB — CBC WITH DIFF
BASOPHIL #: 0.1 10*3/uL (ref 0.00–0.10)
BASOPHIL #: 0.1 x10ˆ3/uL (ref 0.00–0.10)
BASOPHIL %: 1 % (ref 0–3)
EOSINOPHIL #: 0.1 x10ˆ3/uL (ref 0.00–0.50)
EOSINOPHIL %: 2 % (ref 0–5)
HCT: 38.4 % (ref 36.0–45.0)
HCT: 38.4 % (ref 36.0–45.0)
HGB: 12.9 g/dL (ref 12.0–15.5)
LYMPHOCYTE #: 2.1 x10ˆ3/uL (ref 1.00–4.80)
LYMPHOCYTE %: 27 % (ref 15–43)
MCH: 28.8 pg (ref 27.5–33.2)
MCHC: 33.7 g/dL (ref 32.0–36.0)
MCV: 85.4 fL (ref 82.0–97.0)
MONOCYTE #: 0.6 x10ˆ3/uL (ref 0.20–0.90)
MONOCYTE %: 8 % (ref 5–12)
MPV: 9.9 fL (ref 7.4–10.5)
NEUTROPHIL #: 4.8 x10ˆ3/uL (ref 1.50–6.50)
NEUTROPHIL %: 63 % (ref 43–76)
PLATELETS: 152 x10ˆ3/uL (ref 150–450)
RBC: 4.5 x10ˆ6/uL (ref 4.00–5.10)
RDW: 14.6 % (ref 11.0–16.0)
WBC: 7.6 x10ˆ3/uL (ref 4.0–11.0)

## 2015-03-06 LAB — TROPONIN-I: TROPONIN I: <0.03 ng/mL (ref <=0.06)

## 2015-03-06 LAB — B-TYPE NATRIURETIC PEPTIDE (BNP),PLASMA: BNP: 55 pg/mL (ref 0.0–100.0)

## 2015-03-06 LAB — BLUE TOP TUBE

## 2015-03-06 MED ORDER — IOPAMIDOL 370 MG IODINE/ML (76 %) INTRAVENOUS SOLUTION
100.00 mL | INTRAVENOUS | Status: AC
Start: 2015-03-06 — End: 2015-03-06
  Administered 2015-03-06: 84 mL via INTRAVENOUS
  Filled 2015-03-06: qty 100

## 2015-03-06 MED ORDER — SODIUM CHLORIDE 0.9 % (FLUSH) INJECTION SYRINGE
10.0000 mL | INJECTION | Freq: Three times a day (TID) | INTRAMUSCULAR | Status: DC
Start: 2015-03-06 — End: 2015-03-07

## 2015-03-06 MED ORDER — LABETALOL 20 MG/4 ML (5 MG/ML) INTRAVENOUS SYRINGE
10.0000 mg | INJECTION | Freq: Once | INTRAVENOUS | Status: DC
Start: 2015-03-06 — End: 2015-03-07
  Filled 2015-03-06: qty 4

## 2015-03-06 NOTE — ED Nurses Note (Signed)
Patient blood/specimen has been drawn/collected and patient labels shown to patient and/or family member and verified to belong to the patient prior to application on the blood/specimen container(s) at the patient's bedside. Two patient identifiers used for identification verification. Blood sample(s)/specimen(s) sent via tube system to PO[facility].

## 2015-03-06 NOTE — ED Nurses Note (Signed)
Pt complaining of high blood pressure throughout the day, even though she has taken her medications. Pt also complaining that she felt like her heart was beating too hard. She's having shortness of breath and feeling weak.

## 2015-03-06 NOTE — ED Nurses Note (Signed)
EKG completed and shown to Dr. Mongold.

## 2015-03-06 NOTE — ED Provider Notes (Signed)
Karly Pitter, Carlis StableBradley W, MD  [facility]  Emergency Department Visit Note    Date:  03/06/2015  Primary care provider: Dr. Nani Gasseratherine Metheney   Means of arrival:  private car  History obtained from: patient  History limited by: none    Chief Complaint: Shortness of breath    HISTORY OF PRESENT ILLNESS     Amber LexShirley Dumler, date of birth 01/09/1936, is a 79 y.o. female who presents to the Emergency Department complaining of shortness of breath and palpitation that started this evening with a mild headache and generalized weakness.     Patient reports [place] Patient states she has never experienced shortness of breath like this before. Patient adds that her blood pressure was 200/112 today. Patient takes Lisinopril. She has a known hx of PE and a left bundle branch block. No fever, chest pain, rashes, or hx of CAD.      REVIEW OF SYSTEMS     The pertinent positive and negative symptoms are as per HPI. All other systems reviewed and are negative.     PATIENT HISTORY     Past Medical History:  Past Medical History   Diagnosis Date    HTN (hypertension)     Wears glasses      Past Surgical History:  Past Surgical History   Procedure Laterality Date    Hx gall bladder surgery/chole      Hx appendectomy      Hx hysterectomy      Hx small bowel resection      Hx mastectomy, simple       Social History:  Social History   Substance Use Topics    Smoking status: Never Smoker     Smokeless tobacco: Not on file    Alcohol Use: No     History   Drug Use No     Medications:  Outpatient Prescriptions Marked as Taking for the 03/06/15 encounter [facility](Hospital Encounter)   Medication Sig    escitalopram oxalate (LEXAPRO) 10 mg Oral Tablet Take 10 mg by mouth Once a day    famotidine (PEPCID) 20 mg Oral Tablet Take 20 mg by mouth Twice daily    lisinopril (PRINIVIL) 20 mg Oral Tablet Take 20 mg by mouth Once a day     Allergies:  No Known Allergies    PHYSICAL EXAM      Vitals:  Filed Vitals:    03/06/15 2034   BP: 209/92   Pulse: 77   Temp: 36.6 C (97.9 F)   Resp: 18   SpO2: 99%     Pulse ox  99% on None (Room Air) interpreted by me as: Normal    Physical Exam:   General: No apparent acute distress. Very pleasant.   Eyes: Conjunctiva are clear. Pupils are equal, round, and reactive to light and accommodation bilaterally.  HENT: Mucous membranes are moist. Nares are clear. Posterior oropharynx is clear without erythema.  Neck: Supple. No meningeal signs.  Lungs: Faint bibasilar coarseness, otherwise clear to auscultation bilaterally. Good air movement.   Cardiovascular: Normal rate and regular rhythm. Grade 2/6 systolic murmurs. No rubs or gallops.  Abdomen: Soft. Non-tender. No rebound, guarding, or peritoneal signs.   Extremities: Atraumatic. No cyanosis. No significant peripheral edema. Non-tender calves bilaterally.   Skin: Warm and dry.  Neurologic: Strength 5/5 in all major muscle groups. Sensation normal throughout. Cranial nerve exam within normal limits.   Psychiatric: Alert and oriented x  3. Affect within normal limits.    DIAGNOSTIC STUDIES     Labs:  Results for orders placed or performed during the hospital encounter of 03/06/15   COMPREHENSIVE METABOLIC PROFILE - [facility] ONLY   Result Value Ref Range    SODIUM 140 136-145 mmol/L    POTASSIUM 3.9 3.4-5.1 mmol/L    CHLORIDE 105 101-111 mmol/L    CO2 TOTAL 28 22-32 mmol/L    ANION GAP 7 3-11 mmol/L    BUN 20 6-20 mg/dL    CREATININE 1.61 (H) 0.44-1.00 mg/dL    BUN/CREA RATIO 15 0-96    ESTIMATED GFR 38 (L) >60 mL/min/1.29m2    ALBUMIN 3.9 3.5-5.0 g/dL    CALCIUM 9.3 0.4-54.0 mg/dL    GLUCOSE 981 (H) 19-147 mg/dL    ALKALINE PHOSPHATASE 83 38-126 U/L    ALT (SGPT) 18 14-54 U/L    AST (SGOT) 20 15-41 U/L    BILIRUBIN TOTAL 0.2 (L) 0.3-1.2 mg/dL    PROTEIN TOTAL 6.2 (L) 6.4-8.3 g/dL    ALBUMIN/GLOBULIN RATIO 1.7 0.8-2.0   TROPONIN-I   Result Value Ref Range    TROPONIN I <0.03 <=0.06 ng/mL   CBC WITH DIFF   Result  Value Ref Range    WBC 7.6 4.0-11.0 x103/uL    RBC 4.50 4.00-5.10 x106/uL    HGB 12.9 12.0-15.5 g/dL    HCT 82.9 56.2-13.0 %    MCV 85.4 82.0-97.0 fL    MCH 28.8 27.5-33.2 pg    MCHC 33.7 32.0-36.0 g/dL    RDW 86.5 78.4-69.6 %    PLATELETS 152 150-450 x103/uL    MPV 9.9 7.4-10.5 fL    NEUTROPHIL % 63 43-76 %    LYMPHOCYTE % 27 15-43 %    MONOCYTE % 8 5-12 %    EOSINOPHIL % 2 0-5 %    BASOPHIL % 1 0-3 %    NEUTROPHIL # 4.80 1.50-6.50 x103/uL    LYMPHOCYTE # 2.10 1.00-4.80 x103/uL    MONOCYTE # 0.60 0.20-0.90 x103/uL    EOSINOPHIL # 0.10 0.00-0.50 x103/uL    BASOPHIL # 0.10 0.00-0.10 x103/uL   BLUE TOP TUBE   Result Value Ref Range    RAINBOW/EXTRA TUBE AUTO RESULT Yes    B-TYPE NATRIURETIC PEPTIDE   Result Value Ref Range    BNP 55.0 0.0-100.0 pg/mL   TROPONIN-I   Result Value Ref Range    TROPONIN I <0.03 <=0.06 ng/mL     Labs reviewed and interpreted by me.    Radiology:    XR CHEST AP PORTABLE: Negative normal.   CT ANGIO CHEST FOR PULMONARY EMBOLUS: 1. Negative for acute pulmonary embolism. 2. The lungs show bilateral mosaic perfusion with diminished vessel caliber in the lucent regions. While non specific, this finding is often seen with chronic pulmonary thromboembolism.  Radiological imaging interpreted by radiologist and independently reviewed by me.    EKG:  12 lead EKG interpreted by me shows normal sinus rhythm, rate of 68 bpm, LBBB, otherwise no acute ST segment, T-wave, or interval changes.     ED PROGRESS NOTE / MEDICAL DECISION MAKING     I have reviewed this patient's current vital signs. In addition I have examined the available pertinent past medical records and the notes by our nursing staff for this visit. Patient had IV access established and was placed on a monitor throughout her stay  Orders Placed This Encounter    XR CHEST AP PORTABLE (If patient condition warrants)    CT ANGIO CHEST FOR  PULMONARY EMBOLUS    COMPREHENSIVE METABOLIC PROFILE - [facility] ONLY    TROPONIN-I    RAINBOW  DRAW - [facility] ONLY    CBC WITH DIFF    B-TYPE NATRIURETIC PEPTIDE    TROPONIN-I    ECG 12-LEAD (Take to provider with a brief history)    INSERT & MAINTAIN PERIPHERAL IV ACCESS    NS flush syringe    iopamidol (ISOVUE-370) 76% infusion    metoprolol succinate (TOPROL-XL) 24 hr extended release tablet    nitroGLYCERIN (NITROSTAT) sublingual tablet     9:14 PM: Initial evaluation is complete at this time. I discussed with the patient that I would order a chest XR, CT angio, and labs to further evaluate. I will treat the patient with Trandate. I will reevaluate. Patient is agreeable with the treatment plan at this time.    10:20 PM: Per RN, the patient's blood pressure has improved spontaneously. Will hold off on the Labetalol.     11:02 PM: Diagnostic studies reviewed at this time. Will repeat the Troponin at 12 AM.     12:02 AM: On recheck, I discussed with the patient the results of the diagnostic studies which are reassuring. Patient denies any current complaints. Patient's vitals are stable. I gave the patient the option of admission and she declined. If the patient's second troponin is negative I will discharge her. Advised with the patient to speak with her doctor about being placed on another betablocker and possibly a daily baby aspirin. Will consult the Hospitalist about starting the patient on a betablocker now.     12:08 AM: Hospitalist paged.     12:12 AM: I discussed the patient's case and above findings at length with Dr. Bing Quarry [facility]) who suggests adding Toprol 25 mg Qday to her regimen to better control her blood pressure and assist with her symptoms. No further intervention reccommended.     12:20 AM: Will give the patient a dose of Toprol  here, followed by a prescription.     12:38 AM: Second troponin noted at this time which is negative.     12:44 AM: The patient's shortness of breath has returned and she is now also complaining of chest pain. Will repeat and EKG and admit the  patient. She is agreeable. Dr. Bing Quarry (Hospitalist) paged.     12:46 AM: Will treat the patient with Nitro-bid sublingual.     12:54 AM: I discussed the patient's case and above findings at length with Dr. Bing Quarry [facility]) who is making arrangements for admission.     Pre-Disposition Vitals:   03/06/15 2245 03/06/15 2300 03/06/15 2345 03/07/15 0000   BP: 174/66 167/84 161/84 165/87   Pulse: 73 70 73 70   Temp:       Resp: SpO2: 98% 97% 98% 100%       CLINICAL IMPRESSION     Acute chest pain and dyspnea  Acute hypertension  Concern regarding acute coronary syndrome  Concern regarding acute unstable angina    DISPOSITION/PLAN     Admit -- Dr. Bing Quarry (Hospitalist Service)    Condition at Disposition: Fair        SCRIBE ATTESTATION STATEMENT  I Ardis Hughs, SCRIBE scribed for Maurice Small, MD on 03/06/2015 at 9:05 PM. [NS]    Documentation assistance provided for Maurice Small, MD  by Ardis Hughs, SCRIBE. Information recorded by the scribe was done at my direction Delenn Ahn, Carlis Stable, MD.

## 2015-03-07 ENCOUNTER — Encounter (HOSPITAL_BASED_OUTPATIENT_CLINIC_OR_DEPARTMENT_OTHER): Payer: Self-pay

## 2015-03-07 ENCOUNTER — Observation Stay (HOSPITAL_BASED_OUTPATIENT_CLINIC_OR_DEPARTMENT_OTHER): Payer: 59

## 2015-03-07 DIAGNOSIS — I10 Essential (primary) hypertension: Secondary | ICD-10-CM | POA: Diagnosis present

## 2015-03-07 DIAGNOSIS — I340 Nonrheumatic mitral (valve) insufficiency: Secondary | ICD-10-CM

## 2015-03-07 DIAGNOSIS — Z86711 Personal history of pulmonary embolism: Secondary | ICD-10-CM | POA: Diagnosis present

## 2015-03-07 DIAGNOSIS — R0600 Dyspnea, unspecified: Secondary | ICD-10-CM | POA: Diagnosis present

## 2015-03-07 DIAGNOSIS — R079 Chest pain, unspecified: Secondary | ICD-10-CM | POA: Diagnosis present

## 2015-03-07 DIAGNOSIS — I350 Nonrheumatic aortic (valve) stenosis: Secondary | ICD-10-CM

## 2015-03-07 LAB — TROPONIN-I
TROPONIN I: <0.03 ng/mL (ref <=0.06)
TROPONIN I: <0.03 ng/mL (ref <=0.06)
TROPONIN I: <0.03 ng/mL (ref <=0.06)

## 2015-03-07 LAB — CBC WITH DIFF
BASOPHIL #: 0.1 x10ˆ3/uL (ref 0.00–0.10)
BASOPHIL %: 1 % (ref 0–3)
EOSINOPHIL #: 0.1 x10ˆ3/uL (ref 0.00–0.50)
EOSINOPHIL %: 2 % (ref 0–5)
HCT: 36 % (ref 36.0–45.0)
HGB: 12.1 g/dL (ref 12.0–15.5)
LYMPHOCYTE #: 2 x10ˆ3/uL (ref 1.00–4.80)
LYMPHOCYTE %: 25 % (ref 15–43)
MCH: 28.6 pg (ref 27.5–33.2)
MCHC: 33.7 g/dL (ref 32.0–36.0)
MCV: 84.9 fL (ref 82.0–97.0)
MONOCYTE #: 0.6 x10ˆ3/uL (ref 0.20–0.90)
MONOCYTE %: 7 % (ref 5–12)
MPV: 9.8 fL (ref 7.4–10.5)
NEUTROPHIL #: 5.2 x10ˆ3/uL (ref 1.50–6.50)
NEUTROPHIL %: 66 % (ref 43–76)
PLATELETS: 141 x10ˆ3/uL — ABNORMAL LOW (ref 150–450)
RBC: 4.24 x10ˆ6/uL (ref 4.00–5.10)
RDW: 14.7 % (ref 11.0–16.0)
WBC: 8 x10ˆ3/uL (ref 4.0–11.0)

## 2015-03-07 LAB — COMPREHENSIVE METABOLIC PROFILE - BMC/JMC ONLY
ALBUMIN/GLOBULIN RATIO: 1.6 (ref 0.8–2.0)
ALBUMIN: 3.5 g/dL (ref 3.5–5.0)
ALKALINE PHOSPHATASE: 77 U/L (ref 38–126)
ALT (SGPT): 18 U/L (ref 14–54)
ANION GAP: 5 mmol/L (ref 3–11)
AST (SGOT): 18 U/L (ref 15–41)
BILIRUBIN TOTAL: 0.4 mg/dL (ref 0.3–1.2)
BUN/CREA RATIO: 16 (ref 6–22)
BUN: 20 mg/dL (ref 6–20)
CALCIUM: 9 mg/dL (ref 8.8–10.2)
CHLORIDE: 108 mmol/L (ref 101–111)
CHLORIDE: 108 mmol/L (ref 101–111)
CO2 TOTAL: 28 mmol/L (ref 22–32)
CREATININE: 1.22 mg/dL — ABNORMAL HIGH (ref 0.44–1.00)
ESTIMATED GFR: 43 mL/min/1.73mˆ2 — ABNORMAL LOW (ref >60)
GLUCOSE: 126 mg/dL — ABNORMAL HIGH (ref 70–110)
POTASSIUM: 4.2 mmol/L (ref 3.4–5.1)
PROTEIN TOTAL: 5.7 g/dL — ABNORMAL LOW (ref 6.4–8.3)
SODIUM: 141 mmol/L (ref 136–145)

## 2015-03-07 LAB — PTT (PARTIAL THROMBOPLASTIN TIME): APTT: 27.1 s (ref 25.1–36.5)

## 2015-03-07 LAB — LIPID PANEL
CHOL/HDL RATIO: 4.9
CHOLESTEROL: 176 mg/dL (ref 120–199)
HDL CHOL: 36 mg/dL — ABNORMAL LOW (ref 45–65)
LDL CALC: 101 mg/dL (ref <=130)
TRIGLYCERIDES: 194 mg/dL — ABNORMAL HIGH (ref 35–135)
VLDL CALC: 39 mg/dL — ABNORMAL HIGH (ref 5–35)

## 2015-03-07 LAB — PT/INR
INR: 0.95
PROTHROMBIN TIME: 10.4 s (ref 9.4–12.5)

## 2015-03-07 LAB — RED TOP TUBE

## 2015-03-07 LAB — MAGNESIUM: MAGNESIUM: 2.4 mg/dL — ABNORMAL HIGH (ref 1.4–2.1)

## 2015-03-07 LAB — THYROID STIMULATING HORMONE WITH FREE T4 REFLEX: TSH: 4.693 u[IU]/mL (ref 0.340–5.600)

## 2015-03-07 MED ORDER — METOPROLOL SUCCINATE ER 25 MG TABLET,EXTENDED RELEASE 24 HR
25.0000 mg | ORAL_TABLET | Freq: Every day | ORAL | Status: DC
Start: 2015-03-07 — End: 2015-03-07
  Administered 2015-03-07: 25 mg via ORAL
  Filled 2015-03-07: qty 1

## 2015-03-07 MED ORDER — METOPROLOL SUCCINATE ER 25 MG TABLET,EXTENDED RELEASE 24 HR
25.00 mg | ORAL_TABLET | ORAL | Status: DC
Start: 2015-03-07 — End: 2015-03-07

## 2015-03-07 MED ORDER — SODIUM CHLORIDE 0.9 % (FLUSH) INJECTION SYRINGE
10.0000 mL | INJECTION | Freq: Three times a day (TID) | INTRAMUSCULAR | Status: DC
Start: 2015-03-07 — End: 2015-03-08
  Administered 2015-03-07 (×2): 0 mL via INTRAVENOUS
  Administered 2015-03-08: 10 mL via INTRAVENOUS

## 2015-03-07 MED ORDER — NITROGLYCERIN 2 % TRANSDERMAL OINTMENT - PACKET
0.5000 [in_us] | TOPICAL_OINTMENT | Freq: Once | TRANSDERMAL | Status: AC
Start: 2015-03-07 — End: 2015-03-07

## 2015-03-07 MED ORDER — ACETAMINOPHEN 325 MG TABLET
650.0000 mg | ORAL_TABLET | Freq: Four times a day (QID) | ORAL | Status: DC | PRN
Start: 2015-03-07 — End: 2015-03-08
  Administered 2015-03-07: 650 mg via ORAL
  Filled 2015-03-07: qty 2

## 2015-03-07 MED ORDER — METOPROLOL SUCCINATE ER 25 MG TABLET,EXTENDED RELEASE 24 HR
25.0000 mg | ORAL_TABLET | Freq: Every day | ORAL | Status: DC
Start: 2015-03-07 — End: 2015-03-08

## 2015-03-07 MED ORDER — NITROGLYCERIN 0.4 MG SUBLINGUAL TABLET
SUBLINGUAL_TABLET | SUBLINGUAL | Status: AC
Start: 2015-03-07 — End: 2015-03-07
  Filled 2015-03-07: qty 1

## 2015-03-07 MED ORDER — METOPROLOL 25 MG TABLET - EAST
25.00 mg | ORAL_TABLET | ORAL | Status: AC
Start: 2015-03-07 — End: 2015-03-07
  Administered 2015-03-07: 25 mg via ORAL
  Filled 2015-03-07: qty 1

## 2015-03-07 MED ORDER — FAMOTIDINE 20 MG TABLET
20.00 mg | ORAL_TABLET | Freq: Every evening | ORAL | Status: DC
Start: 2015-03-07 — End: 2015-03-08
  Administered 2015-03-07 – 2015-03-08 (×2): 20 mg via ORAL
  Filled 2015-03-07 (×2): qty 1

## 2015-03-07 MED ORDER — ESCITALOPRAM 10 MG TABLET
10.0000 mg | ORAL_TABLET | Freq: Every day | ORAL | Status: DC
Start: 2015-03-07 — End: 2015-03-08
  Administered 2015-03-07 – 2015-03-08 (×2): 10 mg via ORAL
  Filled 2015-03-07 (×2): qty 1

## 2015-03-07 MED ORDER — NITROGLYCERIN 0.4 MG SUBLINGUAL TABLET
0.40 mg | SUBLINGUAL_TABLET | SUBLINGUAL | Status: AC
Start: 2015-03-07 — End: 2015-03-07

## 2015-03-07 MED ORDER — NITROGLYCERIN 2 % TRANSDERMAL OINTMENT - PACKET
TOPICAL_OINTMENT | TRANSDERMAL | Status: AC
Start: 2015-03-07 — End: 2015-03-07
  Administered 2015-03-07: 0.5 [in_us] via TOPICAL
  Filled 2015-03-07: qty 2

## 2015-03-07 MED ORDER — LORAZEPAM 2 MG/ML INJECTION SOLUTION
0.5000 mg | INTRAMUSCULAR | Status: DC | PRN
Start: 2015-03-07 — End: 2015-03-08

## 2015-03-07 MED ORDER — SODIUM CHLORIDE 0.9 % (FLUSH) INJECTION SYRINGE
10.0000 mL | INJECTION | INTRAMUSCULAR | Status: DC | PRN
Start: 2015-03-07 — End: 2015-03-08

## 2015-03-07 MED ORDER — MORPHINE 4 MG/ML INTRAVENOUS CARTRIDGE
4.0000 mg | CARTRIDGE | INTRAVENOUS | Status: DC | PRN
Start: 2015-03-07 — End: 2015-03-08

## 2015-03-07 MED ORDER — ONDANSETRON HCL (PF) 4 MG/2 ML INJECTION SOLUTION
4.0000 mg | Freq: Three times a day (TID) | INTRAMUSCULAR | Status: DC | PRN
Start: 2015-03-07 — End: 2015-03-08

## 2015-03-07 MED ORDER — METOPROLOL SUCCINATE ER 50 MG TABLET,EXTENDED RELEASE 24 HR
50.0000 mg | ORAL_TABLET | Freq: Every day | ORAL | Status: DC
Start: 2015-03-08 — End: 2015-03-08
  Administered 2015-03-08: 50 mg via ORAL
  Filled 2015-03-07: qty 1

## 2015-03-07 MED ORDER — HEPARIN (PORCINE) 5,000 UNIT/ML INJECTION SOLUTION
5000.0000 [IU] | Freq: Three times a day (TID) | INTRAMUSCULAR | Status: DC
Start: 2015-03-07 — End: 2015-03-08
  Administered 2015-03-07: 0 [IU] via SUBCUTANEOUS
  Administered 2015-03-07 – 2015-03-08 (×3): 5000 [IU] via SUBCUTANEOUS
  Filled 2015-03-07 (×2): qty 1

## 2015-03-07 MED ORDER — NITROGLYCERIN 0.4 MG SUBLINGUAL TABLET
0.40 mg | SUBLINGUAL_TABLET | SUBLINGUAL | Status: DC | PRN
Start: 2015-03-07 — End: 2015-03-08

## 2015-03-07 MED ORDER — LISINOPRIL 20 MG TABLET
20.0000 mg | ORAL_TABLET | Freq: Every day | ORAL | Status: DC
Start: 2015-03-07 — End: 2015-03-08
  Administered 2015-03-07 – 2015-03-08 (×2): 20 mg via ORAL
  Filled 2015-03-07 (×2): qty 1

## 2015-03-07 MED ADMIN — acetaminophen 325 mg tablet: ORAL | @ 22:00:00

## 2015-03-07 MED ADMIN — sodium chloride 0.9 % intravenous solution: SUBCUTANEOUS | @ 17:00:00 | NDC 00338004904

## 2015-03-07 NOTE — H&P (Signed)
[facility]  General Medicine  Admission H&P    Date of Service:  03/07/2015  Rennee, Coyne, 79 y.o. female  Date of Admission:  03/06/2015  Date of Birth:  03/03/1936  PCP: Pcp Not In System    Information Obtained from: patient  Chief Complaint:  Elevated BP and shortness of breath    HPI: Kamryn Messineo is a 79 y.o., White female who presents with concerns regarding her BP and dyspnea.  Pt relates that she travelled to the area from [place] to help care for ailing siblings and does so monthly.  She has h/o HTN and noted yesterday that she felt a mild dyspnea and noted her BP to be elevated to 200/112.  Pt reports that her BP has been controlled and is unaware of reason for the elevation.  She does relate some mild chest discomfort associated.  She was evaluated in the ED for ACS and PE with CTA and serial troponin which were normal.  Pt was noted to have hypertension and the decision was made to add LD BB to regimen and allow pt to follow up with her physician in [place].  However, prior to DC pt again developed symptoms and it was decide to observe further.Pt reports h/o stress test several years ago that was normal.  Pt has h/o HTN, LBBB and PE.      PAST MEDICAL:    Past Medical History   Diagnosis Date    HTN (hypertension)     Wears glasses     Wears dentures      Partial       Past Surgical History   Procedure Laterality Date    Hx gall bladder surgery/chole      Hx appendectomy      Hx hysterectomy      Hx small bowel resection      Hx mastectomy, simple       profolactically        Medications Prior to Admission     Prescriptions    escitalopram oxalate (LEXAPRO) 10 mg Oral Tablet    Take 10 mg by mouth Once a day    famotidine (PEPCID) 20 mg Oral Tablet    Take 20 mg by mouth Twice daily    lisinopril (PRINIVIL) 20 mg Oral Tablet    Take 20 mg by mouth Once a day      No Known Allergies  Vaccinations:        Pneumovax: Received after age 35    Influenza: Received seasonal influenza  immunization.    Family History  DAT, HTN, CVA, CAD    Social History  Social History     Social History    Marital Status: Single     Spouse Name: N/A    Number of Children: N/A    Years of Education: N/A     Occupational History    Not on file.     Social History Main Topics    Smoking status: Never Smoker     Smokeless tobacco: Not on file    Alcohol Use: No    Drug Use: No    Sexual Activity: Not on file     Other Topics Concern    Not on file     Social History Narrative       ROS: Other than ROS in the HPI, all other systems were negative.    Examination:  Temperature: 36.4 C (97.6 F)  Heart Rate: 61  BP (Non-Invasive): (!) 178/62  mmHg  Respiratory Rate: 18  SpO2-1: 96 %  Pain Score (Numeric, Faces): 0  General: appears in good health, mildly obese, pale, no distress and vital signs reviewed and discussed with patient  Eyes: Conjunctiva clear., Pupils equal and round, reactive to light and accomodation. , Sclera non-icteric.   HENT:Head atraumatic and normocephalic, ENT without erythema or injection, mucous membranes moist.  Neck: No JVD or thyromegaly or lymphadenopathy  Lungs: Clear to auscultation and percussion bilaterally.   Cardiovascular: regular rate and rhythm, S1, S2 normal, no murmur, click, rub or gallop  Abdomen: Soft, non-tender, Bowel sounds normal, No hepatosplenomegaly, No masses  Genito-urinary: Deferred  Extremities: No cyanosis or edema, No synovitis or joint effusions  Skin: Skin warm and dry  Neurologic: CN II - XII grossly intact , Normal mental status, sensory, motor, cranial nerves, reflexes, coordination, and gait., Alert and oriented x3, No tremor  Lymphatics: No lymphadenopathy  Psychiatric: Normal affect, behavior, memory, thought content, judgement, and speech.    Labs:    I have reviewed all lab results.  Lab Results for Last 24 Hours:    Results for orders placed or performed during the hospital encounter of 03/06/15 (from the past 24 hour(s))   COMPREHENSIVE  METABOLIC PROFILE - [facility] ONLY   Result Value Ref Range    SODIUM 140 136-145 mmol/L    POTASSIUM 3.9 3.4-5.1 mmol/L    CHLORIDE 105 101-111 mmol/L    CO2 TOTAL 28 22-32 mmol/L    ANION GAP 7 3-11 mmol/L    BUN 20 6-20 mg/dL    CREATININE 8.291.34 (H) 0.44-1.00 mg/dL    BUN/CREA RATIO 15 5-626-22    ESTIMATED GFR 38 (L) >60 mL/min/1.4773m2    ALBUMIN 3.9 3.5-5.0 g/dL    CALCIUM 9.3 1.3-08.68.8-10.2 mg/dL    GLUCOSE 578128 (H) 46-96270-110 mg/dL    ALKALINE PHOSPHATASE 83 38-126 U/L    ALT (SGPT) 18 14-54 U/L    AST (SGOT) 20 15-41 U/L    BILIRUBIN TOTAL 0.2 (L) 0.3-1.2 mg/dL    PROTEIN TOTAL 6.2 (L) 6.4-8.3 g/dL    ALBUMIN/GLOBULIN RATIO 1.7 0.8-2.0   TROPONIN-I   Result Value Ref Range    TROPONIN I <0.03 <=0.06 ng/mL   CBC WITH DIFF   Result Value Ref Range    WBC 7.6 4.0-11.0 x103/uL    RBC 4.50 4.00-5.10 x106/uL    HGB 12.9 12.0-15.5 g/dL    HCT 95.238.4 84.1-32.436.0-45.0 %    MCV 85.4 82.0-97.0 fL    MCH 28.8 27.5-33.2 pg    MCHC 33.7 32.0-36.0 g/dL    RDW 40.114.6 02.7-25.311.0-16.0 %    PLATELETS 152 150-450 x103/uL    MPV 9.9 7.4-10.5 fL    NEUTROPHIL % 63 43-76 %    LYMPHOCYTE % 27 15-43 %    MONOCYTE % 8 5-12 %    EOSINOPHIL % 2 0-5 %    BASOPHIL % 1 0-3 %    NEUTROPHIL # 4.80 1.50-6.50 x103/uL    LYMPHOCYTE # 2.10 1.00-4.80 x103/uL    MONOCYTE # 0.60 0.20-0.90 x103/uL    EOSINOPHIL # 0.10 0.00-0.50 x103/uL    BASOPHIL # 0.10 0.00-0.10 x103/uL   BLUE TOP TUBE   Result Value Ref Range    RAINBOW/EXTRA TUBE AUTO RESULT Yes    RED TOP TUBE   Result Value Ref Range    RAINBOW/EXTRA TUBE AUTO RESULT Yes    B-TYPE NATRIURETIC PEPTIDE   Result Value Ref Range    BNP 55.0 0.0-100.0 pg/mL  THYROID STIMULATING HORMONE WITH FREE T4 REFLEX   Result Value Ref Range    TSH 4.693 0.340-5.600 uIU/mL   TROPONIN-I   Result Value Ref Range    TROPONIN I <0.03 <=0.06 ng/mL   LIPID PANEL   Result Value Ref Range    CHOLESTEROL 176 120-199 mg/dL    HDL CHOL 36 (L) 93-23 mg/dL    TRIGLYCERIDES 557 (H) 35-135 mg/dL    LDL CALC 322 <=025 mg/dL    VLDL CALC 39 (H) 4-27  mg/dL    CHOL/HDL RATIO 4.9    TROPONIN-I   Result Value Ref Range    TROPONIN I <0.03 <=0.06 ng/mL   COMPREHENSIVE METABOLIC PROFILE - [facility] ONLY   Result Value Ref Range    SODIUM 141 136-145 mmol/L    POTASSIUM 4.2 3.4-5.1 mmol/L    CHLORIDE 108 101-111 mmol/L    CO2 TOTAL 28 22-32 mmol/L    ANION GAP 5 3-11 mmol/L    BUN 20 6-20 mg/dL    CREATININE 0.62 (H) 0.44-1.00 mg/dL    BUN/CREA RATIO 16 3-76    ESTIMATED GFR 43 (L) >60 mL/min/1.50m2    ALBUMIN 3.5 3.5-5.0 g/dL    CALCIUM 9.0 2.8-31.5 mg/dL    GLUCOSE 176 (H) 16-073 mg/dL    ALKALINE PHOSPHATASE 77 38-126 U/L    ALT (SGPT) 18 14-54 U/L    AST (SGOT) 18 15-41 U/L    BILIRUBIN TOTAL 0.4 0.3-1.2 mg/dL    PROTEIN TOTAL 5.7 (L) 6.4-8.3 g/dL    ALBUMIN/GLOBULIN RATIO 1.6 0.8-2.0   PT/INR   Result Value Ref Range    PROTHROMBIN TIME 10.4 9.4-12.5 seconds    INR 0.95    PTT (PARTIAL THROMBOPLASTIN TIME)   Result Value Ref Range    APTT 27.1 25.1-36.5 seconds   MAGNESIUM   Result Value Ref Range    MAGNESIUM 2.4 (H) 1.4-2.1 mg/dL   CBC WITH DIFF   Result Value Ref Range    WBC 8.0 4.0-11.0 x103/uL    RBC 4.24 4.00-5.10 x106/uL    HGB 12.1 12.0-15.5 g/dL    HCT 71.0 62.6-94.8 %    MCV 84.9 82.0-97.0 fL    MCH 28.6 27.5-33.2 pg    MCHC 33.7 32.0-36.0 g/dL    RDW 54.6 27.0-35.0 %    PLATELETS 141 (L) 150-450 x103/uL    MPV 9.8 7.4-10.5 fL    NEUTROPHIL % 66 43-76 %    LYMPHOCYTE % 25 15-43 %    MONOCYTE % 7 5-12 %    EOSINOPHIL % 2 0-5 %    BASOPHIL % 1 0-3 %    NEUTROPHIL # 5.20 1.50-6.50 x103/uL    LYMPHOCYTE # 2.00 1.00-4.80 x103/uL    MONOCYTE # 0.60 0.20-0.90 x103/uL    EOSINOPHIL # 0.10 0.00-0.50 x103/uL    BASOPHIL # 0.10 0.00-0.10 x103/uL       Imaging Studies:  XR CHEST AP PORTABLE: Negative normal.   CT ANGIO CHEST FOR PULMONARY EMBOLUS: 1. Negative for acute pulmonary embolism. 2. The lungs show bilateral mosaic perfusion with diminished vessel caliber in the lucent regions. While non specific, this finding is often seen with chronic pulmonary  thromboembolism.  Radiological imaging interpreted by radiologist and independently reviewed by me.    EKG:  12 lead EKG interpreted by me shows normal sinus rhythm, rate of 68 bpm, LBBB, otherwise no acute ST segment, T-wave, or interval changes.       DNR Status:  Full Code    Assessment/Plan:  Active Hospital Problems   (*Primary Problem)    Diagnosis    *Chest pain    Dyspnea    Hx of pulmonary embolus    Uncontrolled hypertension       Chest Pain  --Possibly related to HTN  --Ruling out for acute ischemia  --Telemetry monitoring  --Would plan EST once BP controlled  --If serial enzymes negative and pt asymptomatic will defer this to her PCP in [place]    Uncontrolled HTN  --Will not increase ACEI dosing due to elevated Creatinine  --Due to pt's description of palpitation occasionally will begin LD Beta blockade  --Echo ordered        DVT/PE Prophylaxis: Heparin

## 2015-03-07 NOTE — ED Nurses Note (Signed)
Report called to Christine, RN

## 2015-03-07 NOTE — Ancillary Notes (Signed)
Echo completed in cv lab

## 2015-03-07 NOTE — ED Nurses Note (Addendum)
Advised by Lynne LoganErnesto, RN and Dr. Herbie DrapeMongold to continue to hold labetalol at this time. Close monitoring of blood pressure needed and we are to advise Dr. Herbie DrapeMongold if pressure has a significant increase.

## 2015-03-07 NOTE — Care Management Notes (Signed)
03/07/15 1200   Assessment Detail   Assessment Type Admission   Date of Care Management Update 03/07/15   Social Work Plan   Discharge Planning Status initial meeting   Anticipated Discharge Disposition Home   Discharge Needs Assessment   Equipment Currently Used at Home none   Equipment Needed After Discharge none   Transportation Available car;family or friend will provide   Referral Information   Admission Type observation   Arrived From home or self-care   Address Verified verified-no changes  (Phone # correction, registration advised)   Insurance Verified verified-no change   Source of Information Patient   Referral Source admission list   Reason For Consult discharge planning   Record Reviewed clinical discipline documentation;history and physical;medical record;patient profile   Care Manager Assigned to Case HWeaver, RN   Met with pt at beside.  Pt resting comfortably in bed, alert, pleasant.  Pt indicates she resides alone in a single level home with 1 step entry.  Pt indicates she is very active in her community.  She drives and family members could transport her as needed.  Pt indicates her daughters are on their way here from [place] at this time.  Pt indicates she has no DME.  No discharge needs identified at this time.  Spoke with primary nurse, Liane Comber.  Echo is pending at this time.  Possible discharge today, 03/07/15.   Will monitor for consults or discharge needs.

## 2015-03-07 NOTE — ED Nurses Note (Signed)
Rounding completed on patient.  Reviewed vital signs and treatment plan with patient and/or family.  Inquired about any patient needs with particular focus on toileting, pain management, and comfort.  Addressed any expressed needs and inquired again before leaving the room.  Patient and/or family advised that I would return within the hour to re-evaluate the patient and update them on plan or process. Call bell within reach and education on use provided. Will continue to monitor.

## 2015-03-07 NOTE — Progress Notes (Signed)
Rounded on patient this Am and was awaiting echocardiogram.  Patient denies any further chest pain throughout the night. She also denies headache, shortness of breath or any ill feeling today. Planned to discharge home if echo was normal. Received reading from Dr. Dareen PianoAnderson personally. Upon re-evaluating patient this evening, her blood pressure has began to rise again. I added an additional dose of Metoprolol this evening and increased the morning dose of Metoprolol. She denies any chest pain or ill feelings associated with this increased blood pressure.     Per Dr. Dareen PianoAnderson, TTE shows Normal LVEF and mld to moderate mitral regurgitation.     Plan to discharge when blood pressure is better controlled

## 2015-03-07 NOTE — ED Nurses Note (Signed)
Nitro paste removed per Dr Herbie DrapeMongold

## 2015-03-07 NOTE — ED Nurses Note (Signed)
Patient reports relief of chest pain after administration of medication. Patient continues in normal sinus rhythm. Dr. Herbie DrapeMongold updated on patient response to NTG.

## 2015-03-07 NOTE — Pharmacy (Signed)
[facility]  Pharmacy Renal Drug Dosing   Marykay LexFortune,Jossette, 79 y.o. female  Date of Birth:  01-Jul-1935  Date of Admission:  03/06/2015  MRN# [id number]    Current height: Height: 5'  Current weight: Weight: 61.054 kg (134 lb 9.6 oz)  Current BMI: BMI (Calculated): 26.34  Dosing weight: Actual:  62 kg    Lab Results   Component Value Date    CREATININE 1.34* 03/06/2015     Per the [facility] Renal [facility] Dosing Policy, the following changes have been made:    Original Order New Order Comments   Famotidine 20mg  BID Famotidine 20mg  qhs crcl<50               Please contact pharmacy with any questions.    Clydene Fakelireza Jakob Kimberlin, PHARMD   03/07/2015, 02:29

## 2015-03-07 NOTE — Care Plan (Signed)
Problem: General Plan of Care(Adult,OB)  Goal: Plan of Care Review(Adult,OB)  The patient and/or their representative will communicate an understanding of their plan of care   Outcome: Ongoing (see interventions/notes)  Met with pt at beside.  Pt resting comfortably in bed, alert, pleasant.  Pt indicates she resides alone in a single level home with 1 step entry.  Pt indicates she is very active in her community.  She drives and family members could transport her as needed.  Pt indicates her daughters are on their way here from [place] at this time.  Pt indicates she has no DME.  No discharge needs identified at this time.  Spoke with primary nurse, Liane Comber.  Echo is pending at this time.  Possible discharge today, 03/07/15.   Will monitor for consults or discharge needs.

## 2015-03-07 NOTE — Nurses Notes (Signed)
Resumed care of pt. Pt resting, with eyes closed. No complaints of pain at this time. SOB observed on exertion. Pt on tele #19, NSR, HR 60's. See flowsheets for full assessment. Will continue to monitor. Call light within reach.

## 2015-03-07 NOTE — Nurses Notes (Signed)
PT transported via stretcher from ED to room 609 without difficulty.  PT placed on telemetry monitor #19, SR with LBBB, HR 60's. VSS. Full assessment can be viewed in flowsheets. PT denies pain/needs ATT. PT oriented to call bell system and call bell placed within reach. Will continue to monitor PT.

## 2015-03-07 NOTE — ED Nurses Note (Signed)
Pt provided with cup of water per request

## 2015-03-08 LAB — ECG 12-LEAD
Atrial Rate: 68 {beats}/min
Calculated P Axis: 39 degrees
Calculated R Axis: -19 degrees
Calculated T Axis: 66 degrees
QRS Duration: 122 ms
QT Interval: 444 ms
QTC Calculation: 472 ms
Ventricular rate: 68 {beats}/min

## 2015-03-08 MED ORDER — METOPROLOL SUCCINATE ER 25 MG TABLET,EXTENDED RELEASE 24 HR
50.0000 mg | ORAL_TABLET | Freq: Every day | ORAL | Status: AC
Start: 2015-03-08 — End: ?

## 2015-03-08 MED ORDER — ASPIRIN 81 MG CHEWABLE TABLET
81.0000 mg | CHEWABLE_TABLET | Freq: Every day | ORAL | Status: AC
Start: 2015-03-08 — End: ?

## 2015-03-08 NOTE — Care Plan (Signed)
Problem: General Plan of Care(Adult,OB)  Goal: Plan of Care Review(Adult,OB)  The patient and/or their representative will communicate an understanding of their plan of care   Outcome: Ongoing (see interventions/notes)  Plan of care reviewed with patient. Pt verbalized frustration with plan of care and length of stay. More education needed on steps being taken to reduce BP long-term.

## 2015-03-08 NOTE — Nurses Notes (Signed)
Pt requesting to speak with a cardiologist; pt states "I do not think enough is being done to reduce my blood pressure". This RN informed pt that cardiologist was not currently on floor and a note would be entered with her request.

## 2015-03-08 NOTE — Discharge Summary (Signed)
[facility]    DISCHARGE SUMMARY      PATIENT NAMMarykay Joseph:  Joseph,Amber Joseph  MRN:  [id number]  DOB:  04-04-1936    ADMISSION DATE:  03/06/2015  DISCHARGE DATE:  03/08/2015    ATTENDING PHYSICIAN: Amber AmslerMichelle Marguerita Stapp, DO  PRIMARY CARE PHYSICIAN: Pcp Not In System     ADMISSION DIAGNOSIS: Chest pain  Chief Complaint   Patient presents with    Hypertension       DISCHARGE DIAGNOSIS:   Hospital Problems) (* Primary Problem)    Diagnosis Date Noted    *Chest pain 03/07/2015    Dyspnea 03/07/2015    Hx of pulmonary embolus 03/07/2015    Uncontrolled hypertension 03/07/2015      Resolved Hospital Problems    Diagnosis Date Noted Date Resolved   No resolved problems to display.     There are no active non-hospital problems to display for this patient.     DISCHARGE MEDICATIONS:     Current Discharge Medication List      START taking these medications.       Details    aspirin 81 mg Tablet, Chewable    81 mg, Oral, DAILY   Qty:  30 Tab   Refills:  0       metoprolol succinate 25 mg Tablet Sustained Release 24 hr   Commonly known as:  TOPROL-XL    50 mg, Oral, DAILY   Qty:  15 Tab   Refills:  0         CONTINUE these medications - NO CHANGES were made during your visit.       Details    escitalopram oxalate 10 mg Tablet   Commonly known as:  LEXAPRO    10 mg, Oral, DAILY   Refills:  0       famotidine 20 mg Tablet   Commonly known as:  PEPCID    20 mg, Oral, 2 TIMES DAILY   Refills:  0       lisinopril 20 mg Tablet   Commonly known as:  PRINIVIL    20 mg, Oral, DAILY   Refills:  0           DISCHARGE INSTRUCTIONS:     DISCHARGE INSTRUCTION - DIET   Diet: CARDIAC DIET      DISCHARGE INSTRUCTION - ACTIVITY   Activity: GRADUALLY INCREASE ACTIVITY AS TOLERATED      DISCHARGE INSTRUCTION - MISC   Make a follow up with your primary care provider within about one week from discharge.             Follow-up Information     Follow up with System, Pcp Not In. Schedule an appointment as soon as possible for a visit in 1 week.    Why:   Hypertensive urgency        REASON FOR HOSPITALIZATION AND HOSPITAL COURSE:  This is a 79 y.o., female who presented to the ER with the following complaints:  Amber Joseph LexShirley Aro is a 79 y.o., White female who presents with concerns regarding her BP and dyspnea. Pt relates that she travelled to the area from [place] to help care for ailing siblings and does so monthly. She has h/o HTN and noted yesterday that she felt a mild dyspnea and noted her BP to be elevated to 200/112. Pt reports that her BP has been controlled and is unaware of reason for the elevation. She does relate some mild chest discomfort associated. She was evaluated in the ED for  ACS and PE with CTA and serial troponin which were normal. Pt was noted to have hypertension and the decision was made to add LD BB to regimen and allow pt to follow up with her physician in [place]. However, prior to DC pt again developed symptoms and it was decide to observe further.Pt reports h/o stress test several years ago that was normal. Pt has h/o HTN, LBBB and PE.    She was admitted.  Serial troponins were negative.  She underwent CT PE which showed no acute PE but did show some chronic changes from her old PE.  She remained on telemetry monitor which showed no abnormalities.  Her blood pressure was improved with the addition of metoprolol 50 mg daily XL to her regimen.  Her lisinopril was continued.  She underwent a TTE and was found to have a normal ejection fraction and mild mitral regurgitation.  She was discharged home in stable condition.  BP on discharge was 150 systolic.  She will need a blood pressure check in 1-2 weeks, BMP recommended on follow up visit.  Her daughter will drive her home to [facility] and other family will bring her car later.     SIGNIFICANT PHYSICAL FINDINGS: None  SIGNIFICANT LAB:   CBC  Diff   Lab Results   Component Value Date/Time    WBC 8.0 03/07/2015 02:45 AM    HGB 12.1 03/07/2015 02:45 AM    HCT 36.0 03/07/2015 02:45 AM    PLATELETS 141*  03/07/2015 02:45 AM    RBC 4.24 03/07/2015 02:45 AM    MCV 84.9 03/07/2015 02:45 AM    MCHC 33.7 03/07/2015 02:45 AM    MCH 28.6 03/07/2015 02:45 AM    RDW 14.7 03/07/2015 02:45 AM    MPV 9.8 03/07/2015 02:45 AM    Lab Results   Component Value Date/Time    NEUTROPHIL % 66 03/07/2015 02:45 AM    LYMPHOCYTE % 25 03/07/2015 02:45 AM    EOSINOPHIL % 2 03/07/2015 02:45 AM    MONOCYTE % 7 03/07/2015 02:45 AM    BASOPHIL % 1 03/07/2015 02:45 AM    BASOPHIL # 0.10 03/07/2015 02:45 AM    NEUTROPHIL # 5.20 03/07/2015 02:45 AM    LYMPHOCYTE # 2.00 03/07/2015 02:45 AM    EOSINOPHIL # 0.10 03/07/2015 02:45 AM    MONOCYTE # 0.60 03/07/2015 02:45 AM        Comprehensive Metabolic Profile    Lab Results   Component Value Date/Time    SODIUM 141 03/07/2015 02:45 AM    POTASSIUM 4.2 03/07/2015 02:45 AM    CHLORIDE 108 03/07/2015 02:45 AM    CO2 TOTAL 28 03/07/2015 02:45 AM    ANION GAP 5 03/07/2015 02:45 AM    BUN 20 03/07/2015 02:45 AM    CREATININE 1.22* 03/07/2015 02:45 AM    Lab Results   Component Value Date/Time    CALCIUM 9.0 03/07/2015 02:45 AM    ALBUMIN 3.5 03/07/2015 02:45 AM    PROTEIN TOTAL 5.7* 03/07/2015 02:45 AM    ALKALINE PHOSPHATASE 77 03/07/2015 02:45 AM    AST (SGOT) 18 03/07/2015 02:45 AM    ALT (SGPT) 18 03/07/2015 02:45 AM    BILIRUBIN TOTAL 0.4 03/07/2015 02:45 AM          SIGNIFICANT RADIOLOGY:   Results for orders placed or performed during the hospital encounter of 03/06/15   XR CHEST AP PORTABLE (If patient condition warrants)     Status: None  Narrative    Radiologist:  Richardean Chimera, MD               Procedure:  XR CHEST AP PORTABLE     History:  Chest pain    The heart and vessels are unremarkable.  The lungs are clear.  The   costophrenic angles are sharp.  No abnormalities are seen on limited   evaluation of the bones.     Impression:  No acute cardiopulmonary disease.    CT ANGIO CHEST FOR PULMONARY EMBOLUS     Status: None    Narrative    RADIOLOGIST: Richardean Chimera, MD     CT ANGIOGRAM CHEST FOR  PULMONARY EMBOLISM   (DLP:  1152  mGy-cm):     CONTRAST USED: Isovue 370, 85 ml intravenous     REASON FOR EXAMINATION:  dyspnea  Patient c/o Chest pain, high blood pressure & SOB starting this evening.   Hx of prior PE's    COMPARISON STUDY: none     FINDINGS:  The study is negative for acute pulmonary embolism. There are   many bilateral non segmental regions of mosaic perfusion pattern that are   non specific. The caliber of the pulmonary vasculature is significantly   diminished in the more lucent regions.  No evidence of any pneumonia.   There is no effusion. There is no mediastinal adenopathy.    Heart size is within normal limits.    There is diffuse fatty liver.    3D coronal MIP imaging shows no additional findings or filling defects.         Impression       1. Negative for acute pulmonary embolism.  2. The lungs show bilateral mosaic perfusion with diminished vessel   caliber in the lucent regions. While non specific, this finding is often   seen with chronic pulmonary thromboembolism.     TRANSTHORACIC ECHOCARDIOGRAM - ADULT     Status: None    Narrative    Height/weight:  62 inches/139 pounds.  BSA:  1.6 m2. BP:  149/57.  Heart   rate:  65.    Tech:  Hilaria Ota.   Ordering/Referring MD:  Tracey Harries, DO.     EXAM TYPE:  Transthoracic Echocardiogram - Adult.  TECHNICAL QUALITY:   The study is of fair technical quality.  DIAGNOSIS/INDICATIONS:  Exertional dyspnea; murmur.     RHYTHM:  Sinus with intraventricular conduction delay.     2D ECHO/M-MODE ANALYSIS  1.  The left ventricle is normal in size.  Wall thickness is somewhat   difficult to assess as endocardial definition is suboptimal, but is   probably at the upper limits of normal.  Left ventricular systolic   function is normal.  Estimated left ventricular ejection fraction is   55-60%.  No regional wall motion abnormalities are noted.  2.  The left atrium appears normal in the parasternal views.  Left atrial   volume indices were not  obtained.  3.  Right sided chambers are normal in size.  4.  The aortic root is normal in size.  5.  There is no significant pericardial effusion.  6.  No intracardiac masses are noted.  7.  The interatrial septum is not optimally visualized, but appears   grossly intact by two dimensional imaging and color flow Doppler.  8.  The inferior vena cava is normal in size, suggesting normal atrial   pressure.     COLOR FLOW/DOPPLER WAVEFORM/VALVE ANALYSIS  1.  The  pulmonic valve is not well visualized.   2.  The tricuspid valve is suboptimally visualized.  There is a trivial   degree of tricuspid regurgitation.  Estimated right ventricular systolic   pressure is 33 mmHg, based on a mean right atrial pressure of 5 mmHg.  3.  The mitral valve leaflets are mildly thickened and calcified with   normal leaflet mobility.  There is mild mitral regurgitation with a   centrally directed regurgitant jet.  There is no evidence of mitral   stenosis.  4.  Aortic valve morphology is not well defined as the valve is   suboptimally visualized by two dimensional imaging.  The valve leaflets   are heavily thickened and calcified.  There is Doppler evidence of mild to   moderate aortic stenosis with a peak velocity of 2.7 m/sec across the   aortic valve, corresponding to a peak instantaneous gradient of 30 mmHg.    There is a mean gradient of 15 mmHg across the aortic valve.  The   calculated aortic valve orifice area by the continuity equation appears to   be inaccurate due to inaccurate measurement of the left ventricular   outflow tract diameter.  There is no evidence of aortic insufficiency.  5.  There is Doppler evidence of grade I left ventricular diastolic   dysfunction, i.e. impaired left ventricular relaxation pattern.  However,   tissue Doppler indices suggest elevated left atrial pressure.       Impression       1.  Normal left ventricular chamber size and systolic function.  Estimated   left ventricular ejection fraction is  55-60%.  No regional wall motion   abnormalities are noted.  Endocardial definition is somewhat suboptimal,   but left ventricular wall thickness is probably at the upper limits of   normal.  2.  Doppler evidence of grade I left ventricular diastolic dysfunction,   i.e. impaired left ventricular relaxation pattern.  However, tissue   Doppler indices suggest elevated left atrial pressure.  3.  Mild mitral insufficiency.  4.  Mild to moderate aortic stenosis.     2D AND DOPPLER MEASUREMENTS  DIMENSIONS   LVIDd:  (3.5-5.7 cm).  RVIDd:  (2.0-3.0 cm).  LVIDs:  (2.0-4.0 cm).  LVOT diam:  (1.5-2.5 cm).  IVSd:  (0.6-1.0 cm).  LVPWd:  (0.6-1.0 cm).  AO:  (2.0-4.0 cm).  LA:  (1.9-3.9 cm).  EF:  (50-75%).              CONSULTATIONS:   None  PROCEDURES PERFORMED: TTE        COURSE IN HOSPITAL: As listed above.     DOES PATIENT HAVE ADVANCED DIRECTIVES:  No, Information Offered and Refused    ADVANCED CARE PLANNING - Not applicable for this patient    CONDITION ON DISCHARGE: Alert, Oriented and VS Stable   General - well nourished female in no acute distress  HEENT - normocephalic, atraumatic  Heart - RRR, no murmur, rub, gallop  Lungs - Clear to auscultation bilaterally  Abdomen - soft, nondistended, nontender.  BS+  Extremities - no edema  Skin - warm, dry, and pink  Psych - affect normal  Neuro - alert and oriented x 3, moves all extremities well.        DISCHARGE DISPOSITION:  Home discharge     Copies sent to Care Team       Relationship Specialty Notifications Start End    System, Pcp Not In PCP - General  03/06/15           Amber Amsler, DO  03/08/2015, 12:33

## 2015-03-08 NOTE — Nurses Notes (Signed)
Vital signs stable. IV access removed, no complications. AVS reviewed with pt/family members-verbalize understanding of home care/meds, follow-up appointments and reportable signs and symptoms. Pt escorted to personal vehicle and discharged to home in the care of her daughter with all personal belongings.

## 2015-03-13 ENCOUNTER — Ambulatory Visit (INDEPENDENT_AMBULATORY_CARE_PROVIDER_SITE_OTHER): Payer: Commercial Managed Care - HMO | Admitting: Family Medicine

## 2015-03-13 ENCOUNTER — Encounter: Payer: Self-pay | Admitting: Family Medicine

## 2015-03-13 VITALS — BP 130/64 | HR 65 | Temp 97.9°F | Resp 16 | Wt 133.0 lb

## 2015-03-13 DIAGNOSIS — R0789 Other chest pain: Secondary | ICD-10-CM | POA: Diagnosis not present

## 2015-03-13 DIAGNOSIS — I10 Essential (primary) hypertension: Secondary | ICD-10-CM | POA: Diagnosis not present

## 2015-03-13 DIAGNOSIS — Z23 Encounter for immunization: Secondary | ICD-10-CM

## 2015-03-13 DIAGNOSIS — I350 Nonrheumatic aortic (valve) stenosis: Secondary | ICD-10-CM

## 2015-03-13 MED ORDER — METOPROLOL SUCCINATE ER 100 MG PO TB24: 100.0000 mg | ORAL_TABLET | Freq: Every day | ORAL | Status: DC

## 2015-03-13 MED ORDER — METOPROLOL SUCCINATE ER 100 MG PO TB24: 100.0000 mg | ORAL_TABLET | Freq: Every day | ORAL | Status: AC

## 2015-03-13 NOTE — Progress Notes (Signed)
   Subjective:    Patient ID: Deanna Wilson, female    DOB: November 12, 1935, 79 y.o.   MRN: [id number]  HPI  patient is here today for hospital follow-up. She was in [place] visiting her sister and her brother. She had felt fine that day and it was a typical day for her but started to feel some slight palpitations. She denies any chest pain at that time. She checked her blood pressure on her brother's machine and her blood pressure was over 371 systolic. She decided to go to the emergency department and get checked out. They did a formal workup to rule out cardiac disease. They did a CT MG of to rule out a pulmonary embolism. They did keep her for about 2 days because while she was there she started to experience a little bit of tightness in her chest which was new. They discharged her home. She was given metoprolol and started on an aspirin. She is actually  Had a couple of episodes of palpitations since she's been home. Not had any recurrence of chest pain. She has been following her blood pressures since she has been home and brought in a log today.  Blood pressures are ranging from 145-179. Diastolics mostly running in the upper 80s.   aortic stenosis-we had triedto increase her statin to 40 mg but she says it started to make her feel very achy so she went back down to 20 mg.  Review of Systems     Objective:   Physical Exam  Constitutional: She is oriented to person, place, and time. She appears well-developed and well-nourished.  HENT:  Head: Normocephalic and atraumatic.  Neck: Neck supple. No thyromegaly present.  Cardiovascular: Normal rate, regular rhythm and normal heart sounds.   Pulmonary/Chest: Effort normal and breath sounds normal.  Musculoskeletal: She exhibits no edema.  Lymphadenopathy:    She has no cervical adenopathy.  Neurological: She is alert and oriented to person, place, and time.  Skin: Skin is warm and dry.  Psychiatric: She has a normal mood and affect. Her  behavior is normal.          Assessment & Plan:   hypertension-uncontrolled. We'll increase metoprolol 100 mg and see her back in one month. Make sure hydrating well and avoiding salt. We'll try to get records from her hospitalization.   atypical chest pain-seems to have resolved while she was in the hospital and her cardiac workup was negative at that time. She is not had any recurrece of symptoms.   aortic stenosis- she was unable to tolerate 40 mg of Lipitor but is able to take 20 mg. Med list updated.

## 2015-03-18 LAB — ECG 12-LEAD
Atrial Rate: 64 {beats}/min
Calculated P Axis: 51 degrees
Calculated R Axis: -13 degrees
Calculated T Axis: 81 degrees
PR Interval: 190 ms
QRS Duration: 120 ms
QT Interval: 460 ms
QTC Calculation: 474 ms
Ventricular rate: 64 {beats}/min

## 2015-04-11 ENCOUNTER — Ambulatory Visit: Payer: Commercial Managed Care - HMO | Admitting: Family Medicine

## 2015-05-25 ENCOUNTER — Other Ambulatory Visit: Payer: Self-pay | Admitting: Family Medicine

## 2015-06-06 ENCOUNTER — Other Ambulatory Visit: Payer: Self-pay | Admitting: Family Medicine

## 2016-02-02 ENCOUNTER — Other Ambulatory Visit: Payer: Self-pay | Admitting: Family Medicine

## 2016-02-02 DIAGNOSIS — I6523 Occlusion and stenosis of bilateral carotid arteries: Secondary | ICD-10-CM

## 2016-02-16 ENCOUNTER — Encounter (HOSPITAL_COMMUNITY): Payer: Commercial Managed Care - HMO
